# Patient Record
Sex: Male | Born: 1970 | Race: White | Hispanic: No | Marital: Married | Smoking: Never smoker
Health system: Southern US, Community
[De-identification: ages and names within clinical notes are randomized; demographics above are authoritative.]

## PROBLEM LIST (undated history)

## (undated) DIAGNOSIS — F4310 Post-traumatic stress disorder, unspecified: Secondary | ICD-10-CM

## (undated) DIAGNOSIS — N200 Calculus of kidney: Secondary | ICD-10-CM

## (undated) DIAGNOSIS — I10 Essential (primary) hypertension: Secondary | ICD-10-CM

## (undated) HISTORY — PX: LITHOTRIPSY: SUR834

---

## 2004-10-19 ENCOUNTER — Emergency Department: Payer: Self-pay | Admitting: Emergency Medicine

## 2012-05-18 ENCOUNTER — Ambulatory Visit: Payer: Self-pay | Admitting: Family Medicine

## 2012-05-26 ENCOUNTER — Ambulatory Visit: Payer: Self-pay | Admitting: Urology

## 2015-08-27 ENCOUNTER — Ambulatory Visit
Admission: EM | Admit: 2015-08-27 | Discharge: 2015-08-27 | Disposition: A | Discharge status: Home / Self Care | Payer: Self-pay | Attending: Family Medicine | Admitting: Family Medicine

## 2015-08-27 ENCOUNTER — Encounter: Payer: Self-pay | Admitting: Emergency Medicine

## 2015-08-27 DIAGNOSIS — Z024 Encounter for examination for driving license: Secondary | ICD-10-CM

## 2015-08-27 DIAGNOSIS — Z021 Encounter for pre-employment examination: Secondary | ICD-10-CM

## 2015-08-27 LAB — DEPT OF TRANSP DIPSTICK, URINE (ARMC ONLY)
GLUCOSE, UA: NEGATIVE mg/dL
HGB URINE DIPSTICK: NEGATIVE
Protein, ur: 30 mg/dL — AB
Specific Gravity, Urine: 1.025 (ref 1.005–1.030)

## 2015-08-27 NOTE — ED Notes (Signed)
Patient here for a DOT physical.

## 2015-08-27 NOTE — ED Provider Notes (Signed)
CSN: [id number]     Arrival date & time 08/27/15  0800 History   First MD Initiated Contact with Patient 08/27/15 [phone]     Chief Complaint  Patient presents with  . 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 No pertinent past medical history. Past Surgical History  Procedure Laterality Date  . Lithotripsy Left    Family History  Problem Relation Age of Onset  . Cancer Other   . Diabetes Other    Social History  Substance Use Topics  . Smoking status: Never Smoker   . Smokeless tobacco: Never Used  . Alcohol Use: No    Review of Systems   see scanned DOT  Constitutional: No fever. No headache. Eyes: No visual changes. ENT:No sore throat. Cardiovascular:Negative for chest pain/palpitations Respiratory: Negative for shortness of breath Gastrointestinal: No abdominal pain. No nausea,vomiting, diarrhea Genitourinary: Negative for dysuria. Normal urination. Musculoskeletal: Negative for back pain. FROM extremities without pain Skin: Negative for rash Neurological: Negative for headache, focal weakness or numbness  Allergies  Review of patient's allergies indicates no known allergies.  Home Medications   Prior to Admission medications   Not on File   Meds Ordered and Administered this Visit  Medications - No data to display  Ht 6\' 1"  (1.854 m)  Wt 309 lb (140.161 kg)  BMI 40.78 kg/m2 No data found.   Physical Exam.  BP  146/75  Constitutional -alert and oriented,well appearing and in no acute distress General: No  acute distress, obese Head-atraumatic, normocephalic Eyes- conjunctiva normal, EOMI ,conjugate gaze, peripheral OK Ears: grossly normal hearing, canals clear, TM neg Nose- no congestion or rhinorrhea Mouth/throat- mucous membranes moist ,oropharynx non-erythematous Neck- supple without glandular enlargement CV- regular rate and rhythmn Resp-no distress, normal respiratory effort,clear to auscultation bilaterally GI- soft,non-tender,no distention Back- no spinal or para-spinal tenderness, no CVAT, good flexion and extension GU- unremarkable, circumcised, no hernia identified,  MSK- non tender, normal ROM, ambulatory, no gait instability,on /off table solo; toe walk, heel walk. Squat return  Neuro- normal speech and language, no gross focal neurological deficit appreciated, II-XII wnl Skin-warm,dry ,intact;  Psych-mood and affect grossly normal; speech and behavior grossly normal  ED Course  Procedures (including critical care time)  Labs Review Labs Reviewed  DEPT OF TRANSP DIPSTICK, URINE([facility] ONLY) - Abnormal; Notable for the following:    Protein, ur 30 (*)    All other components within normal limits    Imaging Review No results found.   Visual Acuity Review  Right Eye Distance: 20/25 Left Eye Distance: 20/25 Bilateral Distance: 20/20   MDM   1. Encounter for commercial driver medical examination (CDME)     Weight loss encouraged- continue to advance duration and modify activities in gym. Plantar exercises and stretches reviewed Needs to contact PCP to see new assignment-  discussed protein on U/a , needs f/u  With Urology - call office to determine if direct  apptmt or via PCP  2 year certificate given  [id number]  Jan Fireman, PA-C 08/29/15 2256

## 2015-08-28 ENCOUNTER — Encounter: Payer: Self-pay | Admitting: Emergency Medicine

## 2015-08-29 ENCOUNTER — Encounter: Payer: Self-pay | Admitting: Physician Assistant

## 2016-08-21 DIAGNOSIS — F4310 Post-traumatic stress disorder, unspecified: Secondary | ICD-10-CM | POA: Insufficient documentation

## 2017-01-02 ENCOUNTER — Ambulatory Visit (INDEPENDENT_AMBULATORY_CARE_PROVIDER_SITE_OTHER): Payer: 59

## 2017-01-02 ENCOUNTER — Ambulatory Visit
Admission: EM | Admit: 2017-01-02 | Discharge: 2017-01-02 | Disposition: A | Discharge status: Home / Self Care | Payer: 59 | Attending: Emergency Medicine | Admitting: Emergency Medicine

## 2017-01-02 DIAGNOSIS — S62101A Fracture of unspecified carpal bone, right wrist, initial encounter for closed fracture: Secondary | ICD-10-CM

## 2017-01-02 HISTORY — DX: Calculus of kidney: N20.0

## 2017-01-02 HISTORY — DX: Post-traumatic stress disorder, unspecified: F43.10

## 2017-01-02 MED ORDER — IBUPROFEN 600 MG PO TABS: 600.0000 mg | ORAL_TABLET | Freq: Four times a day (QID) | ORAL | 0 refills | Status: DC | PRN

## 2017-01-02 NOTE — ED Notes (Signed)
Volar splint placed and PMS intact post-application
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
## 2017-01-02 NOTE — Discharge Instructions (Signed)
Take the medication as written. Take 1 gram of tylenol with the motrin up to 4 times a day as needed for pain and fever. This with is an effective combination for pain.   Follow-up with Dr. Caralyn Guile, hand surgeon in [place] in 2 weeks. Go to the ER for the signs and symptoms we discussed  Go to www.goodrx.com to look up your medications. This will give you a list of where you can find your prescriptions at the most affordable prices. Or ask the pharmacist what the cash price is. This can be less expensive than what you would pay with insurance.

## 2017-01-15 DIAGNOSIS — S63056A Dislocation of other carpometacarpal joint of unspecified hand, initial encounter: Secondary | ICD-10-CM | POA: Insufficient documentation

## 2017-02-17 HISTORY — PX: FRACTURE SURGERY: SHX138

## 2017-05-03 DIAGNOSIS — F419 Anxiety disorder, unspecified: Secondary | ICD-10-CM | POA: Insufficient documentation

## 2017-05-03 DIAGNOSIS — R454 Irritability and anger: Secondary | ICD-10-CM | POA: Insufficient documentation

## 2017-06-23 DIAGNOSIS — G4733 Obstructive sleep apnea (adult) (pediatric): Secondary | ICD-10-CM | POA: Insufficient documentation

## 2018-03-14 ENCOUNTER — Encounter: Payer: Self-pay | Admitting: Emergency Medicine

## 2018-03-14 ENCOUNTER — Other Ambulatory Visit: Payer: Self-pay

## 2018-03-14 ENCOUNTER — Ambulatory Visit
Admission: EM | Admit: 2018-03-14 | Discharge: 2018-03-14 | Disposition: A | Discharge status: Home / Self Care | Payer: BLUE CROSS/BLUE SHIELD | Attending: Family Medicine | Admitting: Family Medicine

## 2018-03-14 DIAGNOSIS — S39012A Strain of muscle, fascia and tendon of lower back, initial encounter: Secondary | ICD-10-CM | POA: Diagnosis not present

## 2018-03-14 DIAGNOSIS — R809 Proteinuria, unspecified: Secondary | ICD-10-CM | POA: Diagnosis not present

## 2018-03-14 LAB — URINALYSIS, COMPLETE (UACMP) WITH MICROSCOPIC
Bacteria, UA: NONE SEEN
GLUCOSE, UA: NEGATIVE mg/dL
Leukocytes, UA: NEGATIVE
Nitrite: NEGATIVE
PROTEIN: 30 mg/dL — AB
SPECIFIC GRAVITY, URINE: 1.025 (ref 1.005–1.030)
Squamous Epithelial / LPF: NONE SEEN (ref 0–5)
pH: 6 (ref 5.0–8.0)

## 2018-03-14 MED ORDER — HYDROCODONE-ACETAMINOPHEN 5-325 MG PO TABS: ORAL_TABLET | ORAL | 0 refills | Status: DC

## 2018-03-14 MED ORDER — CYCLOBENZAPRINE HCL 10 MG PO TABS: 10.0000 mg | ORAL_TABLET | Freq: Every day | ORAL | 0 refills | Status: DC
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
## 2020-01-12 DIAGNOSIS — Z1211 Encounter for screening for malignant neoplasm of colon: Secondary | ICD-10-CM | POA: Insufficient documentation

## 2020-05-22 DIAGNOSIS — H3522 Other non-diabetic proliferative retinopathy, left eye: Secondary | ICD-10-CM | POA: Insufficient documentation

## 2020-05-22 DIAGNOSIS — H3322 Serous retinal detachment, left eye: Secondary | ICD-10-CM | POA: Insufficient documentation

## 2020-12-16 ENCOUNTER — Encounter: Payer: Self-pay | Admitting: Emergency Medicine

## 2020-12-16 DIAGNOSIS — N200 Calculus of kidney: Secondary | ICD-10-CM | POA: Insufficient documentation

## 2020-12-16 DIAGNOSIS — N201 Calculus of ureter: Secondary | ICD-10-CM | POA: Insufficient documentation

## 2020-12-16 LAB — COMPREHENSIVE METABOLIC PANEL WITH GFR
ALT: 33 U/L (ref 0–44)
AST: 43 U/L — ABNORMAL HIGH (ref 15–41)
Albumin: 4.9 g/dL (ref 3.5–5.0)
Alkaline Phosphatase: 69 U/L (ref 38–126)
Anion gap: 10 (ref 5–15)
BUN: 20 mg/dL (ref 6–20)
CO2: 27 mmol/L (ref 22–32)
Calcium: 9.7 mg/dL (ref 8.9–10.3)
Chloride: 101 mmol/L (ref 98–111)
Creatinine, Ser: 1.37 mg/dL — ABNORMAL HIGH (ref 0.61–1.24)
GFR, Estimated: >60 mL/min
Glucose, Bld: 179 mg/dL — ABNORMAL HIGH (ref 70–99)
Potassium: 4.2 mmol/L (ref 3.5–5.1)
Sodium: 138 mmol/L (ref 135–145)
Total Bilirubin: 1.1 mg/dL (ref 0.3–1.2)
Total Protein: 7.8 g/dL (ref 6.5–8.1)

## 2020-12-16 LAB — CBC
HCT: 43.8 % (ref 39.0–52.0)
Hemoglobin: 15.5 g/dL (ref 13.0–17.0)
MCH: 29.8 pg (ref 26.0–34.0)
MCHC: 35.4 g/dL (ref 30.0–36.0)
MCV: 84.1 fL (ref 80.0–100.0)
Platelets: 231 K/uL (ref 150–400)
RBC: 5.21 MIL/uL (ref 4.22–5.81)
RDW: 11.9 % (ref 11.5–15.5)
WBC: 9.9 K/uL (ref 4.0–10.5)
nRBC: 0 % (ref 0.0–0.2)

## 2020-12-16 LAB — URINALYSIS, COMPLETE (UACMP) WITH MICROSCOPIC
Bacteria, UA: NONE SEEN
Bilirubin Urine: NEGATIVE
Glucose, UA: NEGATIVE mg/dL
Ketones, ur: NEGATIVE mg/dL
Leukocytes,Ua: NEGATIVE
Nitrite: NEGATIVE
Protein, ur: 30 mg/dL — AB
RBC / HPF: >50 RBC/hpf — ABNORMAL HIGH (ref 0–5)
Specific Gravity, Urine: 1.017 (ref 1.005–1.030)
pH: 6 (ref 5.0–8.0)

## 2020-12-16 LAB — LIPASE, BLOOD: Lipase: 87 U/L — ABNORMAL HIGH (ref 11–51)

## 2020-12-16 MED ORDER — MORPHINE SULFATE (PF) 4 MG/ML IV SOLN
4.0000 mg | Freq: Once | INTRAVENOUS | Status: AC
  Administered 2020-12-16: 4 mg via INTRAVENOUS
  Filled 2020-12-16: qty 1

## 2020-12-16 MED ORDER — ONDANSETRON HCL 4 MG/2ML IJ SOLN
4.0000 mg | Freq: Once | INTRAMUSCULAR | Status: AC
  Administered 2020-12-16: 4 mg via INTRAVENOUS
  Filled 2020-12-16: qty 2

## 2020-12-16 NOTE — ED Triage Notes (Signed)
Pt with hx/o kidney stones and reports right sided lower back pain that radiated into the right flank since Thursday. Pt also reports hematuria and emesis.

## 2020-12-17 ENCOUNTER — Emergency Department: Payer: BLUE CROSS/BLUE SHIELD

## 2020-12-17 ENCOUNTER — Emergency Department
Admission: EM | Admit: 2020-12-17 | Discharge: 2020-12-17 | Disposition: A | Discharge status: Home / Self Care | Payer: BLUE CROSS/BLUE SHIELD | Attending: Emergency Medicine | Admitting: Emergency Medicine

## 2020-12-17 DIAGNOSIS — N132 Hydronephrosis with renal and ureteral calculous obstruction: Secondary | ICD-10-CM

## 2020-12-17 DIAGNOSIS — N200 Calculus of kidney: Secondary | ICD-10-CM

## 2020-12-17 MED ORDER — DOCUSATE SODIUM 100 MG PO CAPS: ORAL_CAPSULE | ORAL | 0 refills | Status: DC

## 2020-12-17 MED ORDER — OXYCODONE-ACETAMINOPHEN 5-325 MG PO TABS: 2.0000 | ORAL_TABLET | Freq: Four times a day (QID) | ORAL | 0 refills | Status: DC | PRN

## 2020-12-17 MED ORDER — SODIUM CHLORIDE 0.9 % IV BOLUS
500.0000 mL | Freq: Once | INTRAVENOUS | Status: AC
  Administered 2020-12-17: 500 mL via INTRAVENOUS

## 2020-12-17 MED ORDER — KETOROLAC TROMETHAMINE 30 MG/ML IJ SOLN
15.0000 mg | Freq: Once | INTRAMUSCULAR | Status: AC
  Administered 2020-12-17: 15 mg via INTRAVENOUS
  Filled 2020-12-17: qty 1

## 2020-12-17 MED ORDER — TAMSULOSIN HCL 0.4 MG PO CAPS: ORAL_CAPSULE | ORAL | 0 refills | Status: DC

## 2020-12-17 MED ORDER — ONDANSETRON 4 MG PO TBDP: ORAL_TABLET | ORAL | 0 refills | Status: DC

## 2020-12-17 MED ORDER — OXYCODONE-ACETAMINOPHEN 5-325 MG PO TABS
2.0000 | ORAL_TABLET | Freq: Once | ORAL | Status: AC
Start: 2020-12-17 — End: 2020-12-17
  Administered 2020-12-17: 2 via ORAL
  Filled 2020-12-17: qty 2

## 2020-12-17 NOTE — ED Provider Notes (Signed)
[facility] Emergency Department Provider Note  ____________________________________________   Event Date/Time   First MD Initiated Contact with Patient 12/17/20 0013     (approximate)  I have reviewed the triage vital signs and the nursing notes.   HISTORY  Chief Complaint Flank Pain    HPI Craig Tangen. is a 50 y.o. male with extensive history of kidney stones, multiple episodes and 1 of which required lithotripsy years ago.  He presents tonight for acute onset severe right-sided flank pain that radiates into his right lower abdomen.  The symptoms started 5 days ago when he was sure it was additional kidney stones and he thought he can handle it but the pain became much more severe tonight.  He has had multiple episodes of vomiting as well.  He denies fever, sore throat, chest pain, shortness of breath.  He has noted some hematuria.  He received  morphine 4 mg IV upon arrival to the ED and is currently feeling better and was able to get some sleep.          Past Medical History:  Diagnosis Date  . Kidney stones   . PTSD (post-traumatic stress disorder)     There are no problems to display for this patient.   Past Surgical History:  Procedure Laterality Date  . FRACTURE SURGERY Right 02/2017  . LITHOTRIPSY Left     Prior to Admission medications   Medication Sig Start Date End Date Taking? Authorizing Provider  docusate sodium (COLACE) 100 MG capsule Take 1 tablet once or twice daily as needed for constipation while taking narcotic pain medicine 12/17/20  Yes Hinda Kehr, MD  ondansetron (ZOFRAN ODT) 4 MG disintegrating tablet Allow 1-2 tablets to dissolve in your mouth every 8 hours as needed for nausea/vomiting 12/17/20  Yes Hinda Kehr, MD  oxyCODONE-acetaminophen (PERCOCET) 5-325 MG tablet Take 2 tablets by mouth every 6 (six) hours as needed for severe pain. 12/17/20  Yes Hinda Kehr, MD  tamsulosin (FLOMAX) 0.4 MG CAPS  capsule Take 1 tablet by mouth daily until you pass the kidney stone or no longer have symptoms 12/17/20  Yes Hinda Kehr, MD  busPIRone (BUSPAR) 5 MG tablet Take 5 mg by mouth 2 (two) times daily.    [provider]  cyclobenzaprine (FLEXERIL) 10 MG tablet Take 1 tablet (10 mg total) by mouth at bedtime. 03/14/18   Norval Gable, MD  DULoxetine (CYMBALTA) 30 MG capsule  02/20/18   [provider]  HYDROcodone-acetaminophen (NORCO/VICODIN) 5-325 MG tablet 1-2 tabs po bid prn 03/14/18   Norval Gable, MD  ibuprofen (ADVIL,MOTRIN) 600 MG tablet Take 1 tablet (600 mg total) by mouth every 6 (six) hours as needed. 01/02/17   Melynda Ripple, MD    Allergies Patient has no known allergies.  Family History  Problem Relation Age of Onset  . Cancer Other   . Diabetes Other     Social History Social History   Tobacco Use  . Smoking status: Never Smoker  . Smokeless tobacco: Never Used  Vaping Use  . Vaping Use: Never used  Substance Use Topics  . Alcohol use: Yes    Comment: social  . Drug use: No    Review of Systems Constitutional: No fever/chills Eyes: No visual changes. ENT: No sore throat. Cardiovascular: Denies chest pain. Respiratory: Denies shortness of breath. Gastrointestinal: Right-sided flank and abdominal pain with nausea and vomiting for about 5 days but worsened tonight. Genitourinary: Negative for dysuria. Musculoskeletal: Right-sided flank  pain. Integumentary: Negative for rash. Neurological: Negative for headaches, focal weakness or numbness.   ____________________________________________   PHYSICAL EXAM:  VITAL SIGNS: ED Triage Vitals  Enc Vitals Group     BP 12/16/20 2234 (!) 181/103     Pulse Rate 12/16/20 2234 82     Resp 12/16/20 2234 (!) 21     Temp 12/16/20 2234 97.7 F (36.5 C)     Temp Source 12/16/20 2234 Oral     SpO2 12/16/20 2234 98 %     Weight --      Height --      Head Circumference --      Peak Flow --       Pain Score 12/17/20 0014 5     Pain Loc --      Pain Edu? --      Excl. in [place]? --     Constitutional: Alert and oriented.  Eyes: Conjunctivae are normal.  Head: Atraumatic. Nose: No congestion/rhinnorhea. Mouth/Throat: Patient is wearing a mask. Neck: No stridor.  No meningeal signs.   Cardiovascular: Normal rate, regular rhythm. Good peripheral circulation. Respiratory: Normal respiratory effort.  No retractions. Gastrointestinal: Soft and distended.  Mild tenderness to palpation of the right side of his abdomen. Musculoskeletal: Mild right flank tenderness to percussion. Neurologic:  Normal speech and language. No gross focal neurologic deficits are appreciated.  Skin:  Skin is warm, dry and intact. Psychiatric: Mood and affect are normal. Speech and behavior are normal.  ____________________________________________   LABS (all labs ordered are listed, but only abnormal results are displayed)  Labs Reviewed  LIPASE, BLOOD - Abnormal; Notable for the following components:      Result Value   Lipase 87 (*)    All other components within normal limits  COMPREHENSIVE METABOLIC PANEL - Abnormal; Notable for the following components:   Glucose, Bld 179 (*)    Creatinine, Ser 1.37 (*)    AST 43 (*)    All other components within normal limits  URINALYSIS, COMPLETE (UACMP) WITH MICROSCOPIC - Abnormal; Notable for the following components:   Color, Urine YELLOW (*)    APPearance HAZY (*)    Hgb urine dipstick LARGE (*)    Protein, ur 30 (*)    RBC / HPF >50 (*)    All other components within normal limits  CBC   ____________________________________________   RADIOLOGY I, Hinda Kehr, personally viewed and evaluated these images (plain radiographs) as part of my medical decision making, as well as reviewing the written report by the radiologist.  ED MD interpretation: Multiple right ureteral stones with obstruction and hydronephrosis.  Official radiology report(s): CT  Renal Stone Study  Result Date: 12/17/2020 CLINICAL DATA:  Right-sided low back pain, right flank pain, hematuria, emesis EXAM: CT ABDOMEN AND PELVIS WITHOUT CONTRAST TECHNIQUE: Multidetector CT imaging of the abdomen and pelvis was performed following the standard protocol without IV contrast. COMPARISON:  05/18/2012 FINDINGS: Lower chest: No acute pleural or parenchymal lung disease. Hepatobiliary: No focal liver abnormality is seen. No gallstones, gallbladder wall thickening, or biliary dilatation. Pancreas: Unremarkable. No pancreatic ductal dilatation or surrounding inflammatory changes. Spleen: Normal in size without focal abnormality. Adrenals/Urinary Tract: There is high-grade right-sided obstructive uropathy, with right renal edema and mild perinephric fat stranding. There are 2 obstructing right ureteral calculi, measuring 9 mm at the right UPJ image 48/2 and 7 mm in the mid right ureter image 78/2. A nonobstructing 4 mm distal right ureteral calculus is identified reference image 85/2.  There is a nonobstructing 8 mm calculus in a lower pole calyx of the right kidney. Multiple punctate nonobstructing left renal calculi are seen largest measuring 4 mm in the lower pole. No left-sided obstruction. The bladder is decompressed, limiting evaluation. The adrenals are unremarkable. Stomach/Bowel: No bowel obstruction or ileus. Normal appendix right lower quadrant. No bowel wall thickening or inflammatory change. Vascular/Lymphatic: No significant vascular findings are present. No enlarged abdominal or pelvic lymph nodes. Reproductive: Prostate is unremarkable. Other: No free fluid or free gas. Fat containing left inguinal hernia again noted. No bowel herniation. Musculoskeletal: No acute or destructive bony lesions. Reconstructed images demonstrate no additional findings. IMPRESSION: 1. High-grade right-sided obstructive uropathy, with obstructing right calculi at the UPJ and mid right ureter as described  above. 2. Bilateral nonobstructing renal calculi. 3. Fat containing left inguinal hernia. Electronically Signed   By: Randa Ngo M.D.   On: 12/17/2020 00:22    ____________________________________________   PROCEDURES   Procedure(s) performed (including Critical Care):  Procedures   ____________________________________________   INITIAL IMPRESSION / MDM / [place] / ED COURSE  As part of my medical decision making, I reviewed the following data within the [facility] notes reviewed and incorporated, Labs reviewed , Old chart reviewed and Notes from prior ED visits   Differential diagnosis includes, but is not limited to, kidney stone, appendicitis, UTI/pyelonephritis, infected stone.  Vitals are stable.  Patient feels much better after morphine 4 mg and was able to sleep.  Hematuria but no evidence of infection.  Comprehensive metabolic panel is generally reassuring; I do not know if his creatinine is usually 1.37 I ordered 500 mL normal saline, but he does not appear dehydrated.  Lipase is slightly elevated at 87 which I suspect is due to his recent vomiting but he has no epigastric tenderness to palpation and no persistent vomiting.  No leukocytosis.  CT scan indicates multiple right-sided ureteral stones with obstruction and hydronephrosis.  Since the patient's pain is well controlled he is appropriate for discharge and close outpatient follow-up for probable lithotripsy given the size of the stones which include a 9 mm, 7 mm, and 4 mm stone.  Patient and his wife understand and agree and they know to come back to the emergency department if his pain is uncontrolled or if he develops new or worsening symptoms.  Prior to discharge I ordered Toradol 15 mg IV and 2 Percocet, in addition to the morphine 4 mg IV and the Zofran 4 mg IV he received in triage.  Prescriptions as listed below.            ____________________________________________  FINAL CLINICAL IMPRESSION(S) / ED DIAGNOSES  Final diagnoses:  Kidney stone  Ureteral stone with hydronephrosis     MEDICATIONS GIVEN DURING THIS VISIT:  Medications  morphine 4 MG/ML injection 4 mg (4 mg Intravenous Given 12/16/20 2247)  ondansetron (ZOFRAN) injection 4 mg (4 mg Intravenous Given 12/16/20 2243)  sodium chloride 0.9 % bolus 500 mL (500 mLs Intravenous New Bag/Given 12/17/20 0115)  ketorolac (TORADOL) 30 MG/ML injection 15 mg (15 mg Intravenous Given 12/17/20 0114)  oxyCODONE-acetaminophen (PERCOCET/ROXICET) 5-325 MG per tablet 2 tablet (2 tablets Oral Given 12/17/20 0113)     ED Discharge Orders         Ordered    oxyCODONE-acetaminophen (PERCOCET) 5-325 MG tablet  Every 6 hours PRN        12/17/20 0154    ondansetron (ZOFRAN ODT) 4 MG disintegrating tablet  12/17/20 0154    tamsulosin (FLOMAX) 0.4 MG CAPS capsule        12/17/20 0154    docusate sodium (COLACE) 100 MG capsule        12/17/20 0154           Note:  This document was prepared using Dragon voice recognition software and may include unintentional dictation errors.   Hinda Kehr, MD 12/17/20 [phone]

## 2020-12-17 NOTE — Discharge Instructions (Addendum)
You have been seen in the Emergency Department (ED) today for pain caused by kidney stones.  As we have discussed, please drink plenty of fluids.  Please make a follow up appointment with the physician(s) listed elsewhere in this documentation.  You may take pain medication as needed but ONLY as prescribed.  Please also take your prescribed Flomax daily.  If you are going to follow up with Urology for possible lithotripsy, please do not take any ibuprofen, naproxen, aspirin, Toradol, or other NSAID after Tuesday morning, as this may exclude you from lithotripsy.  Please see your doctor as soon as possible as stones may take 1-3 weeks to pass and you may require additional care or medications.  Do not drink alcohol, drive or participate in any other potentially dangerous activities while taking opiate pain medication as it may make you sleepy. Do not take this medication with any other sedating medications, either prescription or over-the-counter. If you were prescribed Percocet or Vicodin, do not take these with acetaminophen (Tylenol) as it is already contained within these medications.   Take Percocet as needed for severe pain.  This medication is an opiate (or narcotic) pain medication and can be habit forming.  Use it as little as possible to achieve adequate pain control.  Do not use or use it with extreme caution if you have a history of opiate abuse or dependence.  If you are on a pain contract with your primary care doctor or a pain specialist, be sure to let them know you were prescribed this medication today from the [facility].  This medication is intended for your use only - do not give any to anyone else and keep it in a secure place where nobody else, especially children, have access to it.  It will also cause or worsen constipation, so you may want to consider taking an over-the-counter stool softener while you are taking this medication.  Return to the Emergency  Department (ED) or call your doctor if you have any worsening pain, fever, painful urination, are unable to urinate, or develop other symptoms that concern you.  

## 2020-12-18 ENCOUNTER — Encounter: Payer: Self-pay | Admitting: Urology

## 2020-12-18 ENCOUNTER — Ambulatory Visit (INDEPENDENT_AMBULATORY_CARE_PROVIDER_SITE_OTHER): Payer: Self-pay | Admitting: Urology

## 2020-12-18 ENCOUNTER — Other Ambulatory Visit: Payer: Self-pay

## 2020-12-18 VITALS — BP 137/88 | HR 76 | Temp 98.2°F | Ht 73.0 in | Wt 290.0 lb

## 2020-12-18 DIAGNOSIS — N179 Acute kidney failure, unspecified: Secondary | ICD-10-CM

## 2020-12-18 DIAGNOSIS — N200 Calculus of kidney: Secondary | ICD-10-CM

## 2020-12-18 DIAGNOSIS — N201 Calculus of ureter: Secondary | ICD-10-CM

## 2020-12-18 DIAGNOSIS — N1330 Unspecified hydronephrosis: Secondary | ICD-10-CM

## 2020-12-18 NOTE — H&P (View-Only) (Signed)
12/18/2020 4:33 PM   Craig Vargas. [birth year] [id number]  Referring provider: No referring provider defined for this encounter.  Chief Complaint  Patient presents with  . Nephrolithiasis    HPI: 50 year old male who presents today for follow-up of kidney stones.  He was seen yesterday with 5 days of severe right-sided flank pain.  Labs in the ER were unremarkable other than for mildly elevated creatinine to 1.37.  Urinalysis was consistent with acute stone event, greater than 50 red blood cells, 6-10 white blood cells, no bacteria nitrate negative.  He underwent a noncontrast CT stone protocol which showed high-grade obstruction from right UPJ mid ureteral calculus measuring 9 mm and 7 mm respectively.  There is also a 4 mm right distal ureteral stone.  In addition to this, he is a 8 mm nonobstructing right lower pole stone.  He does have a personal history of kidney stones and underwent lithotripsy in the remote past.   This was greater than 10 years ago.  It was effective.  Today, he continues to have severe right flank pain.  He just took a pain pill prior to this visit.  He has had a poor appetite as well as some nausea.  No urinary symptoms including no dysuria or gross hematuria.  No fevers or chills.   PMH: Past Medical History:  Diagnosis Date  . Kidney stones   . PTSD (post-traumatic stress disorder)     Surgical History: Past Surgical History:  Procedure Laterality Date  . FRACTURE SURGERY Right 02/2017  . LITHOTRIPSY Left     Home Medications:  Allergies as of 12/18/2020      Reactions   Prednisolone Anxiety   Irritable      Medication List       Accurate as of December 18, 2020  4:33 PM. If you have any questions, ask your nurse or doctor.        STOP taking these medications   cyclobenzaprine 10 MG tablet Commonly known as: FLEXERIL Stopped by: Hollice Espy, MD   HYDROcodone-acetaminophen 5-325 MG tablet Commonly known as:  NORCO/VICODIN Stopped by: Hollice Espy, MD   ibuprofen 600 MG tablet Commonly known as: ADVIL Stopped by: Hollice Espy, MD     TAKE these medications   busPIRone 5 MG tablet Commonly known as: BUSPAR Take 5 mg by mouth 2 (two) times daily.   docusate sodium 100 MG capsule Commonly known as: Colace Take 1 tablet once or twice daily as needed for constipation while taking narcotic pain medicine   DULoxetine 30 MG capsule Commonly known as: CYMBALTA   losartan 25 MG tablet Commonly known as: COZAAR losartan 25 mg tablet   ondansetron 4 MG disintegrating tablet Commonly known as: Zofran ODT Allow 1-2 tablets to dissolve in your mouth every 8 hours as needed for nausea/vomiting   oxyCODONE-acetaminophen 5-325 MG tablet Commonly known as: Percocet Take 2 tablets by mouth every 6 (six) hours as needed for severe pain.   rosuvastatin 10 MG tablet Commonly known as: CRESTOR Take 10 mg by mouth at bedtime.   tamsulosin 0.4 MG Caps capsule Commonly known as: FLOMAX Take 1 tablet by mouth daily until you pass the kidney stone or no longer have symptoms       Allergies:  Allergies  Allergen Reactions  . Prednisolone Anxiety    Irritable    Family History: Family History  Problem Relation Age of Onset  . Cancer Other   . Diabetes Other  Social History:  reports that he has never smoked. He has never used smokeless tobacco. He reports current alcohol use. He reports that he does not use drugs.   Physical Exam: BP 137/88   Pulse 76   Temp 98.2 F (36.8 C)   Ht 6\' 1"  (1.854 m)   Wt 290 lb (131.5 kg)   BMI 38.26 kg/m   Constitutional:  Alert and oriented, No acute distress.  Accompanied by his wife today. HEENT: [place] AT, moist mucus membranes.  Trachea midline, no masses. Cardiovascular: No clubbing, cyanosis, or edema. Respiratory: Normal respiratory effort, no increased work of breathing. Skin: No rashes, bruises or suspicious lesions. Neurologic:  Grossly intact, no focal deficits, moving all 4 extremities. Psychiatric: Normal mood and affect.  Laboratory Data: Lab Results  Component Value Date   WBC 9.9 12/16/2020   HGB 15.5 12/16/2020   HCT 43.8 12/16/2020   MCV 84.1 12/16/2020   PLT 231 12/16/2020    Lab Results  Component Value Date   CREATININE 1.37 (H) 12/16/2020    Urinalysis Urinalysis reviewed today, similar to emergency room, no concern for UTI  Pertinent Imaging: CT Renal Stone Study  Narrative CLINICAL DATA:  Right-sided low back pain, right flank pain, hematuria, emesis  EXAM: CT ABDOMEN AND PELVIS WITHOUT CONTRAST  TECHNIQUE: Multidetector CT imaging of the abdomen and pelvis was performed following the standard protocol without IV contrast.  COMPARISON:  05/18/2012  FINDINGS: Lower chest: No acute pleural or parenchymal lung disease.  Hepatobiliary: No focal liver abnormality is seen. No gallstones, gallbladder wall thickening, or biliary dilatation.  Pancreas: Unremarkable. No pancreatic ductal dilatation or surrounding inflammatory changes.  Spleen: Normal in size without focal abnormality.  Adrenals/Urinary Tract: There is high-grade right-sided obstructive uropathy, with right renal edema and mild perinephric fat stranding. There are 2 obstructing right ureteral calculi, measuring 9 mm at the right UPJ image 48/2 and 7 mm in the mid right ureter image 78/2. A nonobstructing 4 mm distal right ureteral calculus is identified reference image 85/2. There is a nonobstructing 8 mm calculus in a lower pole calyx of the right kidney.  Multiple punctate nonobstructing left renal calculi are seen largest measuring 4 mm in the lower pole. No left-sided obstruction.  The bladder is decompressed, limiting evaluation. The adrenals are unremarkable.  Stomach/Bowel: No bowel obstruction or ileus. Normal appendix right lower quadrant. No bowel wall thickening or inflammatory  change.  Vascular/Lymphatic: No significant vascular findings are present. No enlarged abdominal or pelvic lymph nodes.  Reproductive: Prostate is unremarkable.  Other: No free fluid or free gas. Fat containing left inguinal hernia again noted. No bowel herniation.  Musculoskeletal: No acute or destructive bony lesions. Reconstructed images demonstrate no additional findings.  IMPRESSION: 1. High-grade right-sided obstructive uropathy, with obstructing right calculi at the UPJ and mid right ureter as described above. 2. Bilateral nonobstructing renal calculi. 3. Fat containing left inguinal hernia.   Electronically Signed By: Randa Ngo M.D. On: 12/17/2020 00:22  CT stone protocol images personally reviewed, agree with radiologic interpretation.  Multiple right ureteral calculi with high-grade obstruction.  Assessment & Plan:    1. Right ureteral calculus Multiple right ureteral calculi as outlined above  Given the high-grade obstruction, persistent pain, as well as number and location of the stones in size, is unlikely that he would be able to pass the spontaneously.  We discussed various treatment options for urolithiasis including observation with or without medical expulsive therapy, shockwave lithotripsy (SWL), ureteroscopy and laser lithotripsy  with stent placement, and percutaneous nephrolithotomy.   We discussed that management is based on stone size, location, density, patient co-morbidities, and patient preference.    Stones <19mm in size have a >80% spontaneous passage rate. Data surrounding the use of tamsulosin for medical expulsive therapy is controversial, but meta analyses suggests it is most efficacious for distal stones between 5-28mm in size. Possible side effects include dizziness/lightheadedness, and retrograde ejaculation.   Ureteroscopy with laser lithotripsy and stent placement has a higher stone free rate than SWL in a single procedure, however  increased complication rate including possible infection, ureteral injury, bleeding, and stent related morbidity. Common stent related symptoms include dysuria, urgency/frequency, and flank pain.  Given multifocal ureteral calculi, I do not think that he is a good candidate for ESWL and this was not offered today.   After an extensive discussion of the risks and benefits of the above treatment options, the patient would like to proceed with right ureteroscopy, laser lithotripsy and right ureteral stent placement  Warning symptoms reviewed, indications for more urgent/emergent evaluation were discussed.  - Urinalysis, Complete  2. Hydronephrosis of right kidney Secondary to #1  3. Right renal stone We will treat nonobstructing stone along with ureteral calculi  4. Acute kidney injury ([place]) Secondary to high-grade obstruction and dehydration   Hollice Espy, MD  [facility] [address], [place], [place] [address] [phone]

## 2020-12-18 NOTE — Patient Instructions (Signed)
Ureteroscopy Ureteroscopy is a procedure to check for and treat problems inside part of the urinary tract. In this procedure, a thin, flexible tube with a light at the end (ureteroscope) is used to look at the inside of the kidneys and the ureters. The ureters are the tubes that carry urine from the kidneys to the bladder. The ureteroscope is inserted into one or both of the ureters. You may need this procedure if you have frequent urinary tract infections (UTIs), blood in your urine, or a stone in one of your ureters. A ureteroscopy can be done:  To find the cause of urine blockage in a ureter and to evaluate other abnormalities inside the ureters or kidneys.  To remove stones.  To remove or treat growths of tissue (polyps), abnormal tissue, and some types of tumors.  To remove a tissue sample and check it for disease under a microscope (biopsy). Tell a health care provider about:  Any allergies you have.  All medicines you are taking, including vitamins, herbs, eye drops, creams, and over-the-counter medicines.  Any problems you or family members have had with anesthetic medicines.  Any blood disorders you have.  Any surgeries you have had.  Any medical conditions you have.  Whether you are pregnant or may be pregnant. What are the risks? Generally, this is a safe procedure. However, problems may occur, including:  Bleeding.  Infection.  Allergic reactions to medicines.  Scarring that narrows the ureter (stricture).  Creating a hole in the ureter (perforation). What happens before the procedure? Staying hydrated Follow instructions from your health care provider about hydration, which may include:  Up to 2 hours before the procedure - you may continue to drink clear liquids, such as water, clear fruit juice, black coffee, and plain tea.   Eating and drinking restrictions Follow instructions from your health care provider about eating and drinking, which may include:  8  hours before the procedure - stop eating heavy meals or foods, such as meat, fried foods, or fatty foods.  6 hours before the procedure - stop eating light meals or foods, such as toast or cereal.  6 hours before the procedure - stop drinking milk or drinks that contain milk.  2 hours before the procedure - stop drinking clear liquids. Medicines Ask your health care provider about:  Changing or stopping your regular medicines. This is especially important if you are taking diabetes medicines or blood thinners.  Taking medicines such as aspirin and ibuprofen. These medicines can thin your blood. Do not take these medicines unless your health care provider tells you to take them.  Taking over-the-counter medicines, vitamins, herbs, and supplements. General instructions  Do not use any products that contain nicotine or tobacco for at least 4 weeks before the procedure. These products include cigarettes, e-cigarettes, and chewing tobacco. If you need help quitting, ask your health care provider.  You may have a urine sample taken to check for infection.  Plan to have someone take you home from the hospital or clinic.  If you will be going home right after the procedure, plan to have someone with you for 24 hours.  Ask your health care provider what steps will be taken to help prevent infection. These may include: ? Washing skin with a germ-killing soap. ? Receiving antibiotic medicine. What happens during the procedure?  An IV will be inserted into one of your veins.  You will be given one or more of the following: ? A medicine to help  you relax (sedative). ? A medicine to make you fall asleep (general anesthetic). ? A medicine that is injected into your spine to numb the area below and slightly above the injection site (spinal anesthetic).  The part of your body that drains urine from your bladder (urethra) will be cleaned with a germ-killing solution.  The ureteroscope will be  passed through your urethra into your bladder.  A salt-water solution will be sent through the ureteroscope to fill your bladder. This will help the health care provider see the openings of your ureters more clearly.  The ureteroscope will be passed into your ureter. ? If a growth is found, a biopsy may be done. ? If a stone is found, it may be removed through the ureteroscope, or the stone may be broken up using a laser, shock waves, or electrical energy. ? In some cases, if the ureter is too small, a tube may be inserted that keeps the ureter open (ureteral stent). The stent may be left in place for 1 or 2 weeks to keep the ureter open, and then the ureteroscopy procedure will be done.  The scope will be removed, and your bladder will be emptied. The procedure may vary among health care providers and hospitals.   What can I expect after the procedure? After your procedure, it is common to have:  Your blood pressure, heart rate, breathing rate, and blood oxygen level monitored until you leave the hospital or clinic.  A burning sensation when you urinate. You may be asked to urinate.  Blood in your urine.  Mild discomfort in your bladder area or kidney area when urinating.  A need to urinate more often or urgently. Follow these instructions at home: Medicines  Take over-the-counter and prescription medicines only as told by your health care provider.  If you were prescribed an antibiotic medicine, take it as told by your health care provider. Do not stop taking the antibiotic even if you start to feel better. General instructions  If you were given a sedative during the procedure, it can affect you for several hours. Do not drive or operate machinery until your health care provider says that it is safe.  To relieve burning, take a warm bath or hold a warm washcloth over your groin.  Drink enough fluid to keep your urine pale yellow. ? Drink two 8-ounce (237 mL) glasses of water  every hour for the first 2 hours after you get home. ? Continue to drink water often at home.  You can eat what you normally do.  Keep all follow-up visits as told by your health care provider. This is important. ? If you had a ureteral stent placed, ask your health care provider when you need to return to have it removed.   Contact a health care provider if you have:  Chills or a fever.  Burning pain for longer than 24 hours after the procedure.  Blood in your urine for longer than 24 hours after the procedure. Get help right away if you have:  Large amounts of blood in your urine.  Blood clots in your urine.  Severe pain.  Chest pain or trouble breathing.  The feeling of a full bladder and you are unable to urinate. These symptoms may represent a serious problem that is an emergency. Do not wait to see if the symptoms will go away. Get medical help right away. Call your local emergency services (911 in the [place] Summary  Ureteroscopy is a procedure to  check for and treat problems inside part of the urinary tract.  In this procedure, a thin, flexible tube with a light at the end (ureteroscope) is used to look at the inside of the kidneys and the ureters.  You may need this procedure if you have frequent urinary tract infections (UTIs), blood in your urine, or a stone in a ureter. This information is not intended to replace advice given to you by your health care provider. Make sure you discuss any questions you have with your health care provider. Document Revised: 04/12/2019 Document Reviewed: 04/12/2019 Elsevier Patient Education  [place].

## 2020-12-18 NOTE — Progress Notes (Signed)
12/18/2020 4:33 PM   Craig Vargas. Jul 31, 1970 [id number]  Referring provider: No referring provider defined for this encounter.  Chief Complaint  Patient presents with  . Nephrolithiasis    HPI: 50 year old male who presents today for follow-up of kidney stones.  He was seen yesterday with 5 days of severe right-sided flank pain.  Labs in the ER were unremarkable other than for mildly elevated creatinine to 1.37.  Urinalysis was consistent with acute stone event, greater than 50 red blood cells, 6-10 white blood cells, no bacteria nitrate negative.  He underwent a noncontrast CT stone protocol which showed high-grade obstruction from right UPJ mid ureteral calculus measuring 9 mm and 7 mm respectively.  There is also a 4 mm right distal ureteral stone.  In addition to this, he is a 8 mm nonobstructing right lower pole stone.  He does have a personal history of kidney stones and underwent lithotripsy in the remote past.   This was greater than 10 years ago.  It was effective.  Today, he continues to have severe right flank pain.  He just took a pain pill prior to this visit.  He has had a poor appetite as well as some nausea.  No urinary symptoms including no dysuria or gross hematuria.  No fevers or chills.   PMH: Past Medical History:  Diagnosis Date  . Kidney stones   . PTSD (post-traumatic stress disorder)     Surgical History: Past Surgical History:  Procedure Laterality Date  . FRACTURE SURGERY Right 02/2017  . LITHOTRIPSY Left     Home Medications:  Allergies as of 12/18/2020      Reactions   Prednisolone Anxiety   Irritable      Medication List       Accurate as of December 18, 2020  4:33 PM. If you have any questions, ask your nurse or doctor.        STOP taking these medications   cyclobenzaprine 10 MG tablet Commonly known as: FLEXERIL Stopped by: Hollice Espy, MD   HYDROcodone-acetaminophen 5-325 MG tablet Commonly known as:  NORCO/VICODIN Stopped by: Hollice Espy, MD   ibuprofen 600 MG tablet Commonly known as: ADVIL Stopped by: Hollice Espy, MD     TAKE these medications   busPIRone 5 MG tablet Commonly known as: BUSPAR Take 5 mg by mouth 2 (two) times daily.   docusate sodium 100 MG capsule Commonly known as: Colace Take 1 tablet once or twice daily as needed for constipation while taking narcotic pain medicine   DULoxetine 30 MG capsule Commonly known as: CYMBALTA   losartan 25 MG tablet Commonly known as: COZAAR losartan 25 mg tablet   ondansetron 4 MG disintegrating tablet Commonly known as: Zofran ODT Allow 1-2 tablets to dissolve in your mouth every 8 hours as needed for nausea/vomiting   oxyCODONE-acetaminophen 5-325 MG tablet Commonly known as: Percocet Take 2 tablets by mouth every 6 (six) hours as needed for severe pain.   rosuvastatin 10 MG tablet Commonly known as: CRESTOR Take 10 mg by mouth at bedtime.   tamsulosin 0.4 MG Caps capsule Commonly known as: FLOMAX Take 1 tablet by mouth daily until you pass the kidney stone or no longer have symptoms       Allergies:  Allergies  Allergen Reactions  . Prednisolone Anxiety    Irritable    Family History: Family History  Problem Relation Age of Onset  . Cancer Other   . Diabetes Other  Social History:  reports that he has never smoked. He has never used smokeless tobacco. He reports current alcohol use. He reports that he does not use drugs.   Physical Exam: BP 137/88   Pulse 76   Temp 98.2 F (36.8 C)   Ht 6\' 1"  (1.854 m)   Wt 290 lb (131.5 kg)   BMI 38.26 kg/m   Constitutional:  Alert and oriented, No acute distress.  Accompanied by his wife today. HEENT: [place] AT, moist mucus membranes.  Trachea midline, no masses. Cardiovascular: No clubbing, cyanosis, or edema. Respiratory: Normal respiratory effort, no increased work of breathing. Skin: No rashes, bruises or suspicious lesions. Neurologic:  Grossly intact, no focal deficits, moving all 4 extremities. Psychiatric: Normal mood and affect.  Laboratory Data: Lab Results  Component Value Date   WBC 9.9 12/16/2020   HGB 15.5 12/16/2020   HCT 43.8 12/16/2020   MCV 84.1 12/16/2020   PLT 231 12/16/2020    Lab Results  Component Value Date   CREATININE 1.37 (H) 12/16/2020    Urinalysis Urinalysis reviewed today, similar to emergency room, no concern for UTI  Pertinent Imaging: CT Renal Stone Study  Narrative CLINICAL DATA:  Right-sided low back pain, right flank pain, hematuria, emesis  EXAM: CT ABDOMEN AND PELVIS WITHOUT CONTRAST  TECHNIQUE: Multidetector CT imaging of the abdomen and pelvis was performed following the standard protocol without IV contrast.  COMPARISON:  05/18/2012  FINDINGS: Lower chest: No acute pleural or parenchymal lung disease.  Hepatobiliary: No focal liver abnormality is seen. No gallstones, gallbladder wall thickening, or biliary dilatation.  Pancreas: Unremarkable. No pancreatic ductal dilatation or surrounding inflammatory changes.  Spleen: Normal in size without focal abnormality.  Adrenals/Urinary Tract: There is high-grade right-sided obstructive uropathy, with right renal edema and mild perinephric fat stranding. There are 2 obstructing right ureteral calculi, measuring 9 mm at the right UPJ image 48/2 and 7 mm in the mid right ureter image 78/2. A nonobstructing 4 mm distal right ureteral calculus is identified reference image 85/2. There is a nonobstructing 8 mm calculus in a lower pole calyx of the right kidney.  Multiple punctate nonobstructing left renal calculi are seen largest measuring 4 mm in the lower pole. No left-sided obstruction.  The bladder is decompressed, limiting evaluation. The adrenals are unremarkable.  Stomach/Bowel: No bowel obstruction or ileus. Normal appendix right lower quadrant. No bowel wall thickening or inflammatory  change.  Vascular/Lymphatic: No significant vascular findings are present. No enlarged abdominal or pelvic lymph nodes.  Reproductive: Prostate is unremarkable.  Other: No free fluid or free gas. Fat containing left inguinal hernia again noted. No bowel herniation.  Musculoskeletal: No acute or destructive bony lesions. Reconstructed images demonstrate no additional findings.  IMPRESSION: 1. High-grade right-sided obstructive uropathy, with obstructing right calculi at the UPJ and mid right ureter as described above. 2. Bilateral nonobstructing renal calculi. 3. Fat containing left inguinal hernia.   Electronically Signed By: Randa Ngo M.D. On: 12/17/2020 00:22  CT stone protocol images personally reviewed, agree with radiologic interpretation.  Multiple right ureteral calculi with high-grade obstruction.  Assessment & Plan:    1. Right ureteral calculus Multiple right ureteral calculi as outlined above  Given the high-grade obstruction, persistent pain, as well as number and location of the stones in size, is unlikely that he would be able to pass the spontaneously.  We discussed various treatment options for urolithiasis including observation with or without medical expulsive therapy, shockwave lithotripsy (SWL), ureteroscopy and laser lithotripsy  with stent placement, and percutaneous nephrolithotomy.   We discussed that management is based on stone size, location, density, patient co-morbidities, and patient preference.    Stones <17mm in size have a >80% spontaneous passage rate. Data surrounding the use of tamsulosin for medical expulsive therapy is controversial, but meta analyses suggests it is most efficacious for distal stones between 5-89mm in size. Possible side effects include dizziness/lightheadedness, and retrograde ejaculation.   Ureteroscopy with laser lithotripsy and stent placement has a higher stone free rate than SWL in a single procedure, however  increased complication rate including possible infection, ureteral injury, bleeding, and stent related morbidity. Common stent related symptoms include dysuria, urgency/frequency, and flank pain.  Given multifocal ureteral calculi, I do not think that he is a good candidate for ESWL and this was not offered today.   After an extensive discussion of the risks and benefits of the above treatment options, the patient would like to proceed with right ureteroscopy, laser lithotripsy and right ureteral stent placement  Warning symptoms reviewed, indications for more urgent/emergent evaluation were discussed.  - Urinalysis, Complete  2. Hydronephrosis of right kidney Secondary to #1  3. Right renal stone We will treat nonobstructing stone along with ureteral calculi  4. Acute kidney injury ([place]) Secondary to high-grade obstruction and dehydration   Hollice Espy, MD  [facility] [address], [place], [place] [address] [phone]

## 2020-12-19 ENCOUNTER — Encounter
Admission: RE | Disposition: A | Discharge status: Home / Self Care | Payer: Self-pay | Source: Home / Self Care | Attending: Urology

## 2020-12-19 ENCOUNTER — Ambulatory Visit: Payer: Self-pay | Admitting: Anesthesiology

## 2020-12-19 ENCOUNTER — Ambulatory Visit
Admission: RE | Admit: 2020-12-19 | Discharge: 2020-12-19 | Disposition: A | Discharge status: Home / Self Care | Payer: Self-pay | Attending: Urology | Admitting: Urology

## 2020-12-19 ENCOUNTER — Ambulatory Visit: Payer: Self-pay

## 2020-12-19 DIAGNOSIS — N201 Calculus of ureter: Secondary | ICD-10-CM

## 2020-12-19 DIAGNOSIS — Z79899 Other long term (current) drug therapy: Secondary | ICD-10-CM | POA: Insufficient documentation

## 2020-12-19 DIAGNOSIS — Z87442 Personal history of urinary calculi: Secondary | ICD-10-CM | POA: Insufficient documentation

## 2020-12-19 DIAGNOSIS — Z888 Allergy status to other drugs, medicaments and biological substances status: Secondary | ICD-10-CM | POA: Insufficient documentation

## 2020-12-19 DIAGNOSIS — N200 Calculus of kidney: Secondary | ICD-10-CM

## 2020-12-19 DIAGNOSIS — N132 Hydronephrosis with renal and ureteral calculous obstruction: Secondary | ICD-10-CM | POA: Insufficient documentation

## 2020-12-19 HISTORY — PX: CYSTOSCOPY/URETEROSCOPY/HOLMIUM LASER/STENT PLACEMENT: SHX6546

## 2020-12-19 SURGERY — CYSTOSCOPY/URETEROSCOPY/HOLMIUM LASER/STENT PLACEMENT
Anesthesia: General | Laterality: Right

## 2020-12-19 SURGERY — LITHOTRIPSY, ESWL
Anesthesia: Moderate Sedation | Laterality: Right

## 2020-12-19 MED ORDER — IOPAMIDOL (ISOVUE-200) INJECTION 41%
INTRAVENOUS | Status: DC | PRN
  Administered 2020-12-19: 5 mL

## 2020-12-19 MED ORDER — KETOROLAC TROMETHAMINE 30 MG/ML IJ SOLN
30.0000 mg | Freq: Once | INTRAMUSCULAR | Status: AC
  Administered 2020-12-19: 30 mg via INTRAVENOUS

## 2020-12-19 MED ORDER — DEXMEDETOMIDINE (PRECEDEX) IN NS 20 MCG/5ML (4 MCG/ML) IV SYRINGE
PREFILLED_SYRINGE | INTRAVENOUS | Status: AC
  Filled 2020-12-19: qty 5

## 2020-12-19 MED ORDER — CEFAZOLIN SODIUM-DEXTROSE 2-4 GM/100ML-% IV SOLN
2.0000 g | INTRAVENOUS | Status: AC
  Administered 2020-12-19: 2 g via INTRAVENOUS

## 2020-12-19 MED ORDER — ONDANSETRON HCL 4 MG/2ML IJ SOLN
INTRAMUSCULAR | Status: AC
  Filled 2020-12-19: qty 2

## 2020-12-19 MED ORDER — ACETAMINOPHEN 10 MG/ML IV SOLN
INTRAVENOUS | Status: AC
  Filled 2020-12-19: qty 100

## 2020-12-19 MED ORDER — ACETAMINOPHEN 10 MG/ML IV SOLN
INTRAVENOUS | Status: DC | PRN
  Administered 2020-12-19: 1000 mg via INTRAVENOUS

## 2020-12-19 MED ORDER — ONDANSETRON HCL 4 MG/2ML IJ SOLN: 4.0000 mg | Freq: Once | INTRAMUSCULAR | Status: DC | PRN

## 2020-12-19 MED ORDER — PROPOFOL 10 MG/ML IV BOLUS
INTRAVENOUS | Status: DC | PRN
  Administered 2020-12-19: 200 mg via INTRAVENOUS

## 2020-12-19 MED ORDER — ROCURONIUM BROMIDE 100 MG/10ML IV SOLN
INTRAVENOUS | Status: DC | PRN
  Administered 2020-12-19: 30 mg via INTRAVENOUS
  Administered 2020-12-19 (×3): 10 mg via INTRAVENOUS
  Administered 2020-12-19: 20 mg via INTRAVENOUS

## 2020-12-19 MED ORDER — DEXAMETHASONE SODIUM PHOSPHATE 10 MG/ML IJ SOLN
INTRAMUSCULAR | Status: AC
  Filled 2020-12-19: qty 1

## 2020-12-19 MED ORDER — CHLORHEXIDINE GLUCONATE 0.12 % MT SOLN
15.0000 mL | Freq: Once | OROMUCOSAL | Status: AC
  Administered 2020-12-19: 15 mL via OROMUCOSAL

## 2020-12-19 MED ORDER — EPHEDRINE 5 MG/ML INJ
INTRAVENOUS | Status: AC
  Filled 2020-12-19: qty 10

## 2020-12-19 MED ORDER — OXYCODONE-ACETAMINOPHEN 5-325 MG PO TABS
1.0000 | ORAL_TABLET | ORAL | Status: AC
  Administered 2020-12-19: 1 via ORAL

## 2020-12-19 MED ORDER — CHLORHEXIDINE GLUCONATE 0.12 % MT SOLN
OROMUCOSAL | Status: AC
  Filled 2020-12-19: qty 15

## 2020-12-19 MED ORDER — DIAZEPAM 10 MG PO TABS: 10.0000 mg | ORAL_TABLET | Freq: Once | ORAL | 0 refills | Status: AC

## 2020-12-19 MED ORDER — BELLADONNA ALKALOIDS-OPIUM 16.2-30 MG RE SUPP
1.0000 | Freq: Once | RECTAL | Status: AC
  Administered 2020-12-19: 1 via RECTAL

## 2020-12-19 MED ORDER — MIDAZOLAM HCL 2 MG/2ML IJ SOLN
INTRAMUSCULAR | Status: AC
  Filled 2020-12-19: qty 2

## 2020-12-19 MED ORDER — ONDANSETRON HCL 4 MG/2ML IJ SOLN
INTRAMUSCULAR | Status: DC | PRN
  Administered 2020-12-19: 4 mg via INTRAVENOUS

## 2020-12-19 MED ORDER — FENTANYL CITRATE (PF) 100 MCG/2ML IJ SOLN: 25.0000 ug | INTRAMUSCULAR | Status: DC | PRN

## 2020-12-19 MED ORDER — SUGAMMADEX SODIUM 200 MG/2ML IV SOLN
INTRAVENOUS | Status: DC | PRN
  Administered 2020-12-19: 263 mg via INTRAVENOUS

## 2020-12-19 MED ORDER — LACTATED RINGERS IV SOLN: INTRAVENOUS | Status: DC | PRN

## 2020-12-19 MED ORDER — FENTANYL CITRATE (PF) 100 MCG/2ML IJ SOLN
INTRAMUSCULAR | Status: DC | PRN
  Administered 2020-12-19: 50 ug via INTRAVENOUS
  Administered 2020-12-19 (×2): 25 ug via INTRAVENOUS

## 2020-12-19 MED ORDER — LIDOCAINE HCL (CARDIAC) PF 100 MG/5ML IV SOSY
PREFILLED_SYRINGE | INTRAVENOUS | Status: DC | PRN
  Administered 2020-12-19: 80 mg via INTRAVENOUS

## 2020-12-19 MED ORDER — LIDOCAINE HCL (PF) 2 % IJ SOLN
INTRAMUSCULAR | Status: AC
  Filled 2020-12-19: qty 5

## 2020-12-19 MED ORDER — BELLADONNA ALKALOIDS-OPIUM 16.2-60 MG RE SUPP
RECTAL | Status: AC
  Filled 2020-12-19: qty 1

## 2020-12-19 MED ORDER — MIDAZOLAM HCL 2 MG/2ML IJ SOLN
INTRAMUSCULAR | Status: DC | PRN
  Administered 2020-12-19: 2 mg via INTRAVENOUS

## 2020-12-19 MED ORDER — DEXMEDETOMIDINE (PRECEDEX) IN NS 20 MCG/5ML (4 MCG/ML) IV SYRINGE
PREFILLED_SYRINGE | INTRAVENOUS | Status: DC | PRN
  Administered 2020-12-19: 8 ug via INTRAVENOUS
  Administered 2020-12-19: 12 ug via INTRAVENOUS

## 2020-12-19 MED ORDER — DEXAMETHASONE SODIUM PHOSPHATE 10 MG/ML IJ SOLN
INTRAMUSCULAR | Status: DC | PRN
  Administered 2020-12-19: 5 mg via INTRAVENOUS

## 2020-12-19 MED ORDER — OXYCODONE-ACETAMINOPHEN 5-325 MG PO TABS
ORAL_TABLET | ORAL | Status: AC
  Filled 2020-12-19: qty 1

## 2020-12-19 MED ORDER — OXYBUTYNIN CHLORIDE 5 MG PO TABS: 5.0000 mg | ORAL_TABLET | Freq: Three times a day (TID) | ORAL | 0 refills | Status: DC | PRN

## 2020-12-19 MED ORDER — CEFAZOLIN SODIUM-DEXTROSE 2-4 GM/100ML-% IV SOLN
INTRAVENOUS | Status: AC
  Filled 2020-12-19: qty 100

## 2020-12-19 MED ORDER — PROPOFOL 10 MG/ML IV BOLUS
INTRAVENOUS | Status: AC
  Filled 2020-12-19: qty 20

## 2020-12-19 MED ORDER — KETOROLAC TROMETHAMINE 30 MG/ML IJ SOLN
INTRAMUSCULAR | Status: AC
  Filled 2020-12-19: qty 1

## 2020-12-19 MED ORDER — LACTATED RINGERS IV SOLN: INTRAVENOUS | Status: DC

## 2020-12-19 MED ORDER — ORAL CARE MOUTH RINSE: 15.0000 mL | Freq: Once | OROMUCOSAL | Status: AC

## 2020-12-19 MED ORDER — EPHEDRINE SULFATE 50 MG/ML IJ SOLN
INTRAMUSCULAR | Status: DC | PRN
  Administered 2020-12-19: 15 mg via INTRAVENOUS
  Administered 2020-12-19: 10 mg via INTRAVENOUS
  Administered 2020-12-19: 15 mg via INTRAVENOUS

## 2020-12-19 SURGICAL SUPPLY — 26 items
BAG DRAIN CYSTO-URO LG1000N (MISCELLANEOUS) ×2 IMPLANT
BASKET ZERO TIP 1.9FR (BASKET) ×2 IMPLANT
BRUSH SCRUB EZ 1% IODOPHOR (MISCELLANEOUS) ×2 IMPLANT
CATH URET FLEX-TIP 2 LUMEN 10F (CATHETERS) ×2 IMPLANT
CATH URETL 5X70 OPEN END (CATHETERS) IMPLANT
CNTNR SPEC 2.5X3XGRAD LEK (MISCELLANEOUS)
CONT SPEC 4OZ STER OR WHT (MISCELLANEOUS)
CONTAINER SPEC 2.5X3XGRAD LEK (MISCELLANEOUS) IMPLANT
DRAPE UTILITY 15X26 TOWEL STRL (DRAPES) ×2 IMPLANT
GLOVE SURG ENC MOIS LTX SZ6.5 (GLOVE) ×2 IMPLANT
GOWN STRL REUS W/ TWL LRG LVL3 (GOWN DISPOSABLE) ×2 IMPLANT
GOWN STRL REUS W/TWL LRG LVL3 (GOWN DISPOSABLE) ×2
GUIDEWIRE GREEN .038 145CM (MISCELLANEOUS) ×2 IMPLANT
GUIDEWIRE STR DUAL SENSOR (WIRE) ×2 IMPLANT
INFUSOR MANOMETER BAG 3000ML (MISCELLANEOUS) ×2 IMPLANT
IV NS IRRIG 3000ML ARTHROMATIC (IV SOLUTION) ×2 IMPLANT
KIT TURNOVER CYSTO (KITS) ×2 IMPLANT
MANIFOLD NEPTUNE II (INSTRUMENTS) ×2 IMPLANT
PACK CYSTO AR (MISCELLANEOUS) ×2 IMPLANT
SET CYSTO W/LG BORE CLAMP LF (SET/KITS/TRAYS/PACK) ×2 IMPLANT
SHEATH URETERAL 12FRX35CM (MISCELLANEOUS) ×2 IMPLANT
STENT URET 6FRX24 CONTOUR (STENTS) IMPLANT
STENT URET 6FRX26 CONTOUR (STENTS) ×2 IMPLANT
SURGILUBE 2OZ TUBE FLIPTOP (MISCELLANEOUS) ×2 IMPLANT
TRACTIP FLEXIVA PULSE ID 200 (Laser) ×2 IMPLANT
WATER STERILE IRR 1000ML POUR (IV SOLUTION) ×2 IMPLANT

## 2020-12-19 NOTE — Anesthesia Preprocedure Evaluation (Signed)
Anesthesia Evaluation  Patient identified by MRN, date of birth, ID band Patient awake    Reviewed: Allergy & Precautions, H&P , NPO status , Patient's Chart, lab work & pertinent test results, reviewed documented beta blocker date and time   Airway Mallampati: III  TM Distance: >3 FB Neck ROM: full    Dental  (+) Teeth Intact   Pulmonary neg pulmonary ROS,    Pulmonary exam normal        Cardiovascular Exercise Tolerance: Good negative cardio ROS Normal cardiovascular exam Rhythm:regular Rate:Normal     Neuro/Psych Anxiety negative neurological ROS  negative psych ROS   GI/Hepatic negative GI ROS, Neg liver ROS,   Endo/Other  Morbid obesity  Renal/GU Renal disease  negative genitourinary   Musculoskeletal   Abdominal   Peds  Hematology negative hematology ROS (+)   Anesthesia Other Findings Past Medical History: No date: Kidney stones No date: PTSD (post-traumatic stress disorder) Past Surgical History: 02/2017: FRACTURE SURGERY; Right No date: LITHOTRIPSY; Left BMI    Body Mass Index: 38.26 kg/m     Reproductive/Obstetrics negative OB ROS                             Anesthesia Physical Anesthesia Plan  ASA: III  Anesthesia Plan: General ETT   Post-op Pain Management:    Induction:   PONV Risk Score and Plan:   Airway Management Planned:   Additional Equipment:   Intra-op Plan:   Post-operative Plan:   Informed Consent: I have reviewed the patients History and Physical, chart, labs and discussed the procedure including the risks, benefits and alternatives for the proposed anesthesia with the patient or authorized representative who has indicated his/her understanding and acceptance.     Dental Advisory Given  Plan Discussed with: CRNA  Anesthesia Plan Comments:         Anesthesia Quick Evaluation

## 2020-12-19 NOTE — Discharge Instructions (Signed)
You have a ureteral stent in place.  This is a tube that extends from your kidney to your bladder.  This may cause urinary bleeding, burning with urination, and urinary frequency.  Please call our office or present to the ED if you develop fevers >101 or pain which is not able to be controlled with oral pain medications.  You may be given either Flomax and/ or ditropan to help with bladder spasms and stent pain in addition to pain medications.   ° °[facility] °[address], [address] °[place], [place] [address] °[phone] ° ° ° °AMBULATORY SURGERY  °DISCHARGE INSTRUCTIONS ° ° °1) The drugs that you were given will stay in your system until tomorrow so for the next 24 hours you should not: ° °A) Drive an automobile °B) Make any legal decisions °C) Drink any alcoholic beverage ° ° °2) You may resume regular meals tomorrow.  Today it is better to start with liquids and gradually work up to solid foods. ° °You may eat anything you prefer, but it is better to start with liquids, then soup and crackers, and gradually work up to solid foods. ° ° °3) Please notify your doctor immediately if you have any unusual bleeding, trouble breathing, redness and pain at the surgery site, drainage, fever, or pain not relieved by medication. ° ° ° °4) Additional Instructions: ° ° ° ° ° ° ° °Please contact your physician with any problems or Same Day Surgery at [phone], Monday through Friday 6 am to 4 pm, or [place] Main number at [phone]. °

## 2020-12-19 NOTE — Op Note (Signed)
Date of procedure: 12/19/20  Preoperative diagnosis:  1. Right ureteral calculi x3 2. Right renal stone 3. Right hydronephrosis  Postoperative diagnosis:  1. Same as above  Procedure: 1. Right ureteroscopy with laser lithotripsy 2. Right retrograde pyelogram 3. Right basket extraction of stone fragment 4. Retrieval right ureteral foreign body 5. Right ureteral stent placement 6. Interpretation of fluoroscopy less than 30 minutes  Surgeon: Hollice Espy, MD  Anesthesia: General  Complications: None  Intraoperative findings: Multiple ureteral calculi including fairly impacted right stone at the level of the iliacs, distal ureteral stone and much larger right proximal ureteral stone as well as a nonobstructing stone.  Infundibular stenosis of the midpole calyx.  Malpositioned ureteral stent retracted into distal ureter requiring retrieval.  In the end, stent adequate position and all stones fragmented/obliterated.  EBL: Minimal  Specimens: Stone fragment  Drains: 6 x 26 French double-J ureteral stent on right  Indication: Craig Vargas. is a 50 y.o. patient with multiple right distal ureteral stones as well as nonobstructing stone.  After reviewing the management options for treatment, he elected to proceed with the above surgical procedure(s). We have discussed the potential benefits and risks of the procedure, side effects of the proposed treatment, the likelihood of the patient achieving the goals of the procedure, and any potential problems that might occur during the procedure or recuperation. Informed consent has been obtained.  Description of procedure:  The patient was taken to the operating room and general anesthesia was induced.  The patient was placed in the dorsal lithotomy position, prepped and draped in the usual sterile fashion, and preoperative antibiotics were administered. A preoperative time-out was performed.   A 21 French cystoscope was advanced per  urethra into the bladder.  Attention was turned to the right ureteral orifice.  On scout imaging, at least 2 of the ureteral stones could be seen easily, the proximal ureter as well as at the level of the iliacs.  The distal stone was a little more difficult to see.  A sensor wire was then placed up to the level of the kidney which went easily.  This was snapped in place as a safety wire.  A long semirigid ureteroscope was advanced into the UO with a first stone was encountered in the very distal portion of the ureter.  A 242 m laser fiber was then brought in and using dusting settings of 0.3 J and 60 Hz, the stone was fragmented into tiny pieces.  Most of these pieces passed out of the ureter spontaneously into the bladder.  I then advanced the scope up to the level of the iliacs.  This is a little bit more challenging and the stone was relatively impacted and larger.  I was able to fragment the stone and eventually remove the pieces using 1.9 [place] tipless nitinol basket.  I was not able to advance the scope up beyond this level.  I did inject some contrast to perform a retrograde pyelogram at this point in time which showed no contrast extravasation and no other filling defects in the proximal ureter except for the level of the UPJ where another stone was seen.  Upon backing my scope out, he did have some difficulty removing the scope presumably due to the presence of a fragment closer to the level of the UVJ and ended up having to inject some K-Y jelly through the scope in order to lubricate and remove it.  It was ultimately successfully removed.  I then used  a dual-lumen access sheath to introduce a Super Stiff wire up to the level of the kidney as a working wire.  A Cook 12/14 French ureteral access sheath, 35 cm was advanced to the level of the mid ureter fluoroscopically which went fairly easily.  A flexible digital single channel Wolf ureteroscope was then advanced to the level of the proximal ureter  where the last ureteral stone was encountered.  This was quite large.  A sustained dusting settings to fragment a portion of the stone and the remaining portion retropulsed into the kidney.  There, I treated the remaining stone fragments from the proximal ureteral stone as well as a nonobstructing stone.  Notably, the patient did have fairly narrow infundibulum especially in the midpole calyces through which the scope was barely able to traverse.  Some of the fragments ended up in 1 of these midpole calyces and I spent a good amount of time ensuring that the fragments were extremely small, smaller than the tip of the laser fiber as there was some concern that they would be retained in this dilated calyx with a very stenotic infundibulum.  Finally, a final retrograde pyelogram was performed.  Roadmap of the kidney.  There was no contrast extravasation and each calyx was directly visualized.  There is no significant residual stone burden remaining.  The scope was then backed down the length the ureter inspecting along the way.  Within the distal ureter, there were still a few more fragments which were removed with a basket.  At the level of the iliacs, there is a small mucosal abrasion which appeared to be superficial presumably where the stone was previously impacted.  Finally, a 6 x 26 French double-J ureteral stent was placed fluoroscopically.  Unfortunately, upon removing the wire, the distal portion of the stent retracted into the distal ureter.  I readvanced the cystoscope and directed towards the right ureteral orifice.  A sensor wire was then placed back through the UO and interestingly, ended up cannulating the open end of the ureteral stent within the ureter.  I then readvanced the semirigid ureteroscope up to the level of the stent and by manipulating the wire and scope because able to drive the stent down back to the appropriate level such that a curl was created both within the kidney as well as within  the bladder.  The bladder was then drained.  The patient was then cleaned and dried, repositioned in supine position, reversed from anesthesia, and taken to the PACU in stable condition.  Plan: We will have him return next week for cystoscopy, stent removal.   Hollice Espy, M.D.

## 2020-12-19 NOTE — Interval H&P Note (Signed)
History and Physical Interval Note:  12/19/2020 2:09 PM  Craig Vargas.  has presented today for surgery, with the diagnosis of Ureteral obstruction.  The various methods of treatment have been discussed with the patient and family. After consideration of risks, benefits and other options for treatment, the patient has consented to  Procedure(s): CYSTOSCOPY/URETEROSCOPY/HOLMIUM LASER/STENT PLACEMENT (Right) as a surgical intervention.  The patient's history has been reviewed, patient examined, no change in status, stable for surgery.  I have reviewed the patient's chart and labs.  Questions were answered to the patient's satisfaction.    RRR CTAB  Hollice Espy

## 2020-12-19 NOTE — Anesthesia Postprocedure Evaluation (Signed)
Anesthesia Post Note  Patient: Craig Vargas.  Procedure(s) Performed: CYSTOSCOPY/URETEROSCOPY/HOLMIUM LASER/STENT PLACEMENT (Right )  Patient location during evaluation: PACU Anesthesia Type: General Level of consciousness: awake and alert Pain management: pain level controlled Vital Signs Assessment: post-procedure vital signs reviewed and stable Respiratory status: spontaneous breathing and respiratory function stable Cardiovascular status: stable Anesthetic complications: no   No complications documented.   Last Vitals:  Vitals:   12/19/20 1305 12/19/20 1645  BP: (!) 160/109 (!) 146/83  Pulse: 80 85  Resp: 18 (!) 23  Temp: 36.7 C   SpO2: 97% 100%    Last Pain:  Vitals:   12/19/20 1645  TempSrc:   PainSc: 0-No pain                 Lala Been K

## 2020-12-19 NOTE — Transfer of Care (Signed)
Immediate Anesthesia Transfer of Care Note  Patient: Craig Vargas.  Procedure(s) Performed: CYSTOSCOPY/URETEROSCOPY/HOLMIUM LASER/STENT PLACEMENT (Right )  Patient Location: PACU  Anesthesia Type:General  Level of Consciousness: awake, alert  and oriented  Airway & Oxygen Therapy: Patient Spontanous Breathing and Patient connected to face mask oxygen  Post-op Assessment: Report given to RN and Post -op Vital signs reviewed and stable  Post vital signs: Reviewed and stable  Last Vitals:  Vitals Value Taken Time  BP    Temp    Pulse    Resp    SpO2      Last Pain:  Vitals:   12/19/20 1305  TempSrc: Oral  PainSc: 0-No pain         Complications: No complications documented.

## 2020-12-19 NOTE — Anesthesia Procedure Notes (Signed)
Procedure Name: Intubation Date/Time: 12/19/2020 2:52 PM Performed by: Philbert Riser, CRNA Pre-anesthesia Checklist: Patient identified, Emergency Drugs available, Suction available and Patient being monitored Patient Re-evaluated:Patient Re-evaluated prior to induction Oxygen Delivery Method: Circle system utilized Preoxygenation: Pre-oxygenation with 100% oxygen Induction Type: IV induction Ventilation: Mask ventilation without difficulty Grade View: Grade III Tube type: Oral Tube size: 7.5 mm Number of attempts: 2 Airway Equipment and Method: Stylet and Oral airway Placement Confirmation: ETT inserted through vocal cords under direct vision,  positive ETCO2 and breath sounds checked- equal and bilateral Tube secured with: Tape Dental Injury: Teeth and Oropharynx as per pre-operative assessment

## 2020-12-20 ENCOUNTER — Encounter: Payer: Self-pay | Admitting: Urology

## 2020-12-20 LAB — URINALYSIS, COMPLETE
Bilirubin, UA: NEGATIVE
Glucose, UA: NEGATIVE
Ketones, UA: NEGATIVE
Leukocytes,UA: NEGATIVE
Nitrite, UA: NEGATIVE
Protein,UA: NEGATIVE
Specific Gravity, UA: 1.02 (ref 1.005–1.030)
Urobilinogen, Ur: 1 mg/dL (ref 0.2–1.0)
pH, UA: 5.5 (ref 5.0–7.5)

## 2020-12-20 LAB — MICROSCOPIC EXAMINATION: Bacteria, UA: NONE SEEN

## 2020-12-24 ENCOUNTER — Ambulatory Visit (INDEPENDENT_AMBULATORY_CARE_PROVIDER_SITE_OTHER): Payer: Self-pay | Admitting: Urology

## 2020-12-24 ENCOUNTER — Other Ambulatory Visit: Payer: Self-pay

## 2020-12-24 ENCOUNTER — Encounter: Payer: Self-pay | Admitting: Urology

## 2020-12-24 VITALS — BP 133/95 | HR 76 | Ht 73.0 in | Wt 310.0 lb

## 2020-12-24 DIAGNOSIS — N200 Calculus of kidney: Secondary | ICD-10-CM

## 2020-12-24 LAB — URINALYSIS, COMPLETE
Bilirubin, UA: NEGATIVE
Glucose, UA: NEGATIVE
Ketones, UA: NEGATIVE
Nitrite, UA: NEGATIVE
Specific Gravity, UA: 1.015 (ref 1.005–1.030)
Urobilinogen, Ur: 1 mg/dL (ref 0.2–1.0)
pH, UA: 7 (ref 5.0–7.5)

## 2020-12-24 LAB — CALCULI, WITH PHOTOGRAPH (CLINICAL LAB)
Calcium Oxalate Monohydrate: 100 %
Weight Calculi: 2 mg

## 2020-12-24 LAB — MICROSCOPIC EXAMINATION
Bacteria, UA: NONE SEEN
RBC, Urine: >30 /hpf — ABNORMAL HIGH (ref 0–2)

## 2020-12-24 MED ORDER — SULFAMETHOXAZOLE-TRIMETHOPRIM 800-160 MG PO TABS
1.0000 | ORAL_TABLET | Freq: Once | ORAL | Status: AC
  Administered 2020-12-24: 1 via ORAL

## 2020-12-24 NOTE — Progress Notes (Signed)
   12/24/20  CC:  Chief Complaint  Patient presents with  . Cysto Stent Removal    HPI: 50 year old male with multiple ureteral calculi as well as nonobstructing stones who returns today for cystoscopy, stent removal.  He is premedicated today with Valium due to anxiety.  He had no issues with the stent other than some pressure in his kidney with voiding.  Urinalysis today is consistent with known stent without concern for infection.  He is given a periprocedural Bactrim today.  Cystoscopy/ Stent removal procedure  Patient identification was confirmed, informed consent was obtained, and patient was prepped using Betadine solution.  Lidocaine jelly was administered per urethral meatus.    Preoperative abx where received prior to procedure.    Procedure: - Flexible cystoscope introduced, without any difficulty.   - Thorough search of the bladder revealed:    normal urethral meatus  Stent seen emanating from right ureteral orifice, grasped with stent graspers, and removed in entirety.     Post-Procedure: - Patient tolerated the procedure well   Assessment/ Plan:  1. Kidney stones Status post uncomplicated right ureteroscopy for multiple ureteral and kidney renal calculi  Warning symptoms reviewed  Him follow-up in 1 month renal ultrasound prior - Urinalysis, Complete - sulfamethoxazole-trimethoprim (BACTRIM DS) 800-160 MG per tablet 1 tablet - RUS  Hollice Espy, MD

## 2020-12-25 ENCOUNTER — Other Ambulatory Visit: Payer: Self-pay

## 2020-12-25 MED ORDER — OXYCODONE-ACETAMINOPHEN 5-325 MG PO TABS: 2.0000 | ORAL_TABLET | Freq: Four times a day (QID) | ORAL | 0 refills | Status: DC | PRN

## 2020-12-25 NOTE — Telephone Encounter (Signed)
Patient called stating he had severe pain post stent removal yesterday. 4 hours last night of uncontrollable pain and vomiting, he describes similar to stone passage. He is better this morning with taking his last tablet of percocet. He is also utilizing oxybutinin and tamsulosin and increasing fluid intake. Patient unable to come to clinic today for Toradol injection, no ride available. Requested refill on pain medication

## 2020-12-30 ENCOUNTER — Telehealth: Payer: Self-pay

## 2020-12-30 MED ORDER — TAMSULOSIN HCL 0.4 MG PO CAPS: ORAL_CAPSULE | ORAL | 0 refills | Status: DC

## 2020-12-30 NOTE — Telephone Encounter (Signed)
Patient called stating that post stent removal he has been having a lot of flank pain and stone fragments passing. He has stopped taking Percocet due to side effects and is taking OTC pain medication when needed. He is out of Flomax and is requesting a refill since he is still passing fragments. He describes a severe pain episode yesterday evening lasting over 4 hours. Patient used a stim machine on kidney area to try and break up fragments in kidney he felt that he might have a blockage. He noted a release after about an hour of stimulation and was then able to sleep. This morning he passed a large amount of fragments and is doing much better. Flomax was refilled and patient was encouraged to keep utilizing OTC pain medication as needed and increased water intake. Keep follow up as scheduled Dr. Erlene Quan was verbally notified of patient's status update

## 2021-01-15 ENCOUNTER — Telehealth: Payer: Self-pay | Admitting: *Deleted

## 2021-01-15 ENCOUNTER — Ambulatory Visit: Payer: Self-pay

## 2021-01-15 NOTE — Telephone Encounter (Signed)
Called patient to inform the need for renal ultrasound prior to follow up with Dr. Erlene Quan. Patient canceled ultrasound-states he has too many medical bills and no insurance does not want to follow up or have any more expenses due to imaging.

## 2021-01-21 ENCOUNTER — Ambulatory Visit: Payer: Self-pay | Admitting: Urology

## 2021-01-22 ENCOUNTER — Other Ambulatory Visit: Payer: Self-pay | Admitting: Urology

## 2021-02-06 ENCOUNTER — Other Ambulatory Visit: Payer: Self-pay | Admitting: Urology

## 2021-06-09 ENCOUNTER — Ambulatory Visit: Payer: Self-pay | Admitting: *Deleted

## 2021-06-09 NOTE — Telephone Encounter (Signed)
Pt called in to re-establish care with Dr. Otilio Miu with [facility].   He lost his insurance and recently got it again.   He saw Dr. Ronnald Ramp many years ago before losing insurance.  I scheduled him with Dr. Ronnald Ramp for September 19, 2021 to establish as a new pt.   In the mean time I referred him to the urgent care so he could restart his BP medication since his BP is elevated and he is having headaches.   He was agreeable to this then mentioned he has a tele visit program he can do with his new insurance.   He is going to call them first and if that doesn't work out for getting an rx for his BP medication he will go to the urgent care.    He has been off the losartan for 6-7 months now.

## 2021-06-09 NOTE — Telephone Encounter (Signed)
Reason for Disposition  Systolic BP  >= 826 OR Diastolic >= 415    Referred to Urgent Care because not established with PCP until March 2023  Answer Assessment - Initial Assessment Questions 1. BLOOD PRESSURE: "What is the blood pressure?" "Did you take at least two measurements 5 minutes apart?"     I just recently got insurance.  This morning 159/100.  2 days ago it was higher.   I'm getting headaches.   I need a new dr.   I didn't have insurance.   I saw Dr. Ronnald Ramp many years ago before I lost my insurance. 2. ONSET: "When did you take your blood pressure?"     This morning 3. HOW: "How did you obtain the blood pressure?" (e.g., visiting nurse, automatic home BP monitor)     Home monitor 4. HISTORY: "Do you have a history of high blood pressure?"     Yes 5. MEDICATIONS: "Are you taking any medications for blood pressure?" "Have you missed any doses recently?"     Losartan 5 mg  I lost my insurance last year.   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   170/110 2 days ago 6. OTHER SYMPTOMS: "Do you have any symptoms?" (e.g., headache, chest pain, blurred vision, difficulty breathing, weakness)     Headaches.   7. PREGNANCY: "Is there any chance you are pregnant?" "When was your last menstrual period?"     N/A  Protocols used: Blood Pressure - High-A-AH

## 2021-06-10 ENCOUNTER — Other Ambulatory Visit: Payer: Self-pay

## 2021-06-10 ENCOUNTER — Ambulatory Visit
Admission: EM | Admit: 2021-06-10 | Discharge: 2021-06-10 | Disposition: A | Discharge status: Home / Self Care | Payer: 59 | Attending: Physician Assistant | Admitting: Physician Assistant

## 2021-06-10 DIAGNOSIS — I10 Essential (primary) hypertension: Secondary | ICD-10-CM | POA: Diagnosis not present

## 2021-06-10 DIAGNOSIS — E785 Hyperlipidemia, unspecified: Secondary | ICD-10-CM | POA: Insufficient documentation

## 2021-06-10 DIAGNOSIS — R739 Hyperglycemia, unspecified: Secondary | ICD-10-CM | POA: Insufficient documentation

## 2021-06-10 DIAGNOSIS — Z760 Encounter for issue of repeat prescription: Secondary | ICD-10-CM | POA: Diagnosis not present

## 2021-06-10 HISTORY — DX: Essential (primary) hypertension: I10

## 2021-06-10 LAB — LIPID PANEL
Cholesterol: 193 mg/dL (ref 0–200)
HDL: 32 mg/dL — ABNORMAL LOW (ref >40)
LDL Cholesterol: 117 mg/dL — ABNORMAL HIGH (ref 0–99)
Total CHOL/HDL Ratio: 6 RATIO
Triglycerides: 219 mg/dL — ABNORMAL HIGH (ref <150)
VLDL: 44 mg/dL — ABNORMAL HIGH (ref 0–40)

## 2021-06-10 LAB — COMPREHENSIVE METABOLIC PANEL
ALT: 50 U/L — ABNORMAL HIGH (ref 0–44)
AST: 60 U/L — ABNORMAL HIGH (ref 15–41)
Albumin: 4.5 g/dL (ref 3.5–5.0)
Alkaline Phosphatase: 73 U/L (ref 38–126)
Anion gap: 10 (ref 5–15)
BUN: 20 mg/dL (ref 6–20)
CO2: 24 mmol/L (ref 22–32)
Calcium: 9.3 mg/dL (ref 8.9–10.3)
Chloride: 105 mmol/L (ref 98–111)
Creatinine, Ser: 1 mg/dL (ref 0.61–1.24)
GFR, Estimated: >60 mL/min (ref >60)
Glucose, Bld: 139 mg/dL — ABNORMAL HIGH (ref 70–99)
Potassium: 4.4 mmol/L (ref 3.5–5.1)
Sodium: 139 mmol/L (ref 135–145)
Total Bilirubin: 1 mg/dL (ref 0.3–1.2)
Total Protein: 7.9 g/dL (ref 6.5–8.1)

## 2021-06-10 LAB — CBC WITH DIFFERENTIAL/PLATELET
Abs Immature Granulocytes: 0.01 10*3/uL (ref 0.00–0.07)
Basophils Absolute: 0.1 10*3/uL (ref 0.0–0.1)
Basophils Relative: 1 %
Eosinophils Absolute: 0.1 10*3/uL (ref 0.0–0.5)
Eosinophils Relative: 3 %
HCT: 49.5 % (ref 39.0–52.0)
Hemoglobin: 16.6 g/dL (ref 13.0–17.0)
Immature Granulocytes: 0 %
Lymphocytes Relative: 30 %
Lymphs Abs: 1.3 10*3/uL (ref 0.7–4.0)
MCH: 29.7 pg (ref 26.0–34.0)
MCHC: 33.5 g/dL (ref 30.0–36.0)
MCV: 88.6 fL (ref 80.0–100.0)
Monocytes Absolute: 0.4 10*3/uL (ref 0.1–1.0)
Monocytes Relative: 8 %
Neutro Abs: 2.6 10*3/uL (ref 1.7–7.7)
Neutrophils Relative %: 58 %
Platelets: 221 10*3/uL (ref 150–400)
RBC: 5.59 MIL/uL (ref 4.22–5.81)
RDW: 12 % (ref 11.5–15.5)
WBC: 4.5 10*3/uL (ref 4.0–10.5)
nRBC: 0 % (ref 0.0–0.2)

## 2021-06-10 MED ORDER — ROSUVASTATIN CALCIUM 10 MG PO TABS: 10.0000 mg | ORAL_TABLET | Freq: Every day | ORAL | 3 refills | Status: DC

## 2021-06-10 MED ORDER — LOSARTAN POTASSIUM 25 MG PO TABS: 25.0000 mg | ORAL_TABLET | Freq: Every day | ORAL | 3 refills | Status: DC

## 2021-06-10 NOTE — Discharge Instructions (Signed)
-  We have drawn some labs today.  I will call you with the results.  I will have the results of the CBC and CMP today but the cholesterol panel may be back tomorrow or the next day.  Someone will contact her with the results. - I have refilled your hypertension and hyperlipidemia medications to get you to your appointment.  Make sure you keep your appointment as we cannot continue to provide refills. - Make sure to get 20 to 30 minutes of exercise a day, drink plenty of water, reduce dietary sodium and try your best to follow a healthy eating plan. - Keep a log of your blood pressures so that you can take it to your visit with your PCP. - Go to ER if you ever have any chest pain, shortness of breath, palpitations, swelling of your legs, severe headaches or dizziness.

## 2021-06-10 NOTE — ED Triage Notes (Addendum)
Pt states he is off his blood pressure medications x one year. He reports his blood pressure has been elevated and was told to come here for medication until he can be seen. Also needs his statin medication. States he "feels good" and is without complaints. His appt for PCP is March 2023. Meds are Losartan 50mg  and Rosuvastatin 10mg 

## 2021-06-10 NOTE — ED Provider Notes (Signed)
[facility]    CSN: [id number] Arrival date & time: 06/10/21  3267      History   Chief Complaint Chief Complaint  Patient presents with   Medication Refill   Hypertension    HPI Craig Vargas. is a 50 y.o. male with history of hypertension, hyperlipidemia, PTSD and nephrolithiasis.  Patient presents today for refill of losartan and Crestor.  Patient reports being off of these medications for about a year since losing insurance and not seeing his PCP.  Reports he has felt well recently.  States that he started a new diet and exercise plan.  Reports that he has been checking his blood pressure and it has been elevated in the 124P to 809X systolic over 83J to 825K diastolic.  Patient says he had a headache last week but other than that he has not had any headaches, dizziness, vision changes, chest pain, palpitations, shortness of breath, weakness, fatigue, leg swelling.  Patient reports that he does have an appointment with a PCP on 09/19/2020.  Reports he contacted the office about getting his medications but they suggested that he go to urgent care to be prescribed a supply of the medication until he can see them in their office.  Patient denies any alcohol use and does not smoke tobacco.  He states that his PTSD is well controlled with duloxetine and buspirone.  Reports that he has plenty of these medications to get to the appointment he believes.  No other complaints today.  HPI  Past Medical History:  Diagnosis Date   Hypertension    Kidney stones    PTSD (post-traumatic stress disorder)     There are no problems to display for this patient.   Past Surgical History:  Procedure Laterality Date   CYSTOSCOPY/URETEROSCOPY/HOLMIUM LASER/STENT PLACEMENT Right 12/19/2020   Procedure: CYSTOSCOPY/URETEROSCOPY/HOLMIUM LASER/STENT PLACEMENT;  Surgeon: Hollice Espy, MD;  Location: [facility] ORS;  Service: Urology;  Laterality: Right;   FRACTURE SURGERY Right 02/2017    LITHOTRIPSY Left        Home Medications    Prior to Admission medications   Medication Sig Start Date End Date Taking? Authorizing Provider  losartan (COZAAR) 25 MG tablet Take 1 tablet (25 mg total) by mouth daily. 06/10/21 07/10/21 Yes Laurene Footman B, PA-C  rosuvastatin (CRESTOR) 10 MG tablet Take 1 tablet (10 mg total) by mouth daily. 06/10/21 07/10/21 Yes Laurene Footman B, PA-C  busPIRone (BUSPAR) 5 MG tablet Take 5 mg by mouth 2 (two) times daily.    [provider]  diazepam (VALIUM) 10 MG tablet Take 10 mg by mouth once. 12/19/20   [provider]  DULoxetine (CYMBALTA) 30 MG capsule 30 mg 2 (two) times daily. 02/20/18   [provider]    Family History Family History  Problem Relation Age of Onset   Cancer Other    Diabetes Other     Social History Social History   Tobacco Use   Smoking status: Never    Passive exposure: Never   Smokeless tobacco: Never  Vaping Use   Vaping Use: Never used  Substance Use Topics   Alcohol use: Yes    Comment: occasional   Drug use: No     Allergies   Patient has no known allergies.   Review of Systems Review of Systems  Constitutional:  Negative for fatigue.  Respiratory:  Negative for chest tightness, shortness of breath and wheezing.   Cardiovascular:  Negative for chest pain, palpitations and leg swelling.  Neurological:  Negative for dizziness, weakness and headaches.  Psychiatric/Behavioral:  The patient is nervous/anxious (History of PTSD).     Physical Exam Triage Vital Signs ED Triage Vitals  Enc Vitals Group     BP 06/10/21 0849 (!) 153/110     Pulse Rate 06/10/21 0849 73     Resp 06/10/21 0849 19     Temp 06/10/21 0849 98.1 F (36.7 C)     Temp Source 06/10/21 0849 Oral     SpO2 06/10/21 0849 99 %     Weight 06/10/21 0844 290 lb (131.5 kg)     Height 06/10/21 0844 6\' 1"  (1.854 m)     Head Circumference --      Peak Flow --      Pain Score 06/10/21 0844 0     Pain Loc --       Pain Edu? --      Excl. in [place]? --    No data found.  Updated Vital Signs BP (!) 153/110 (BP Location: Left Arm)   Pulse 73   Temp 98.1 F (36.7 C) (Oral)   Resp 19   Ht 6\' 1"  (1.854 m)   Wt 290 lb (131.5 kg)   SpO2 99%   BMI 38.26 kg/m      Physical Exam Vitals and nursing note reviewed.  Constitutional:      General: He is not in acute distress.    Appearance: Normal appearance. He is well-developed. He is obese. He is not ill-appearing.  HENT:     Head: Normocephalic and atraumatic.  Eyes:     General: No scleral icterus.    Conjunctiva/sclera: Conjunctivae normal.  Cardiovascular:     Rate and Rhythm: Normal rate and regular rhythm.     Heart sounds: Normal heart sounds.  Pulmonary:     Effort: Pulmonary effort is normal. No respiratory distress.     Breath sounds: Normal breath sounds.  Musculoskeletal:        General: No swelling.     Cervical back: Neck supple.  Skin:    General: Skin is warm and dry.     Capillary Refill: Capillary refill takes less than 2 seconds.  Neurological:     General: No focal deficit present.     Mental Status: He is alert. Mental status is at baseline.     Motor: No weakness.     Coordination: Coordination normal.     Gait: Gait normal.  Psychiatric:        Mood and Affect: Mood normal.        Behavior: Behavior normal.        Thought Content: Thought content normal.     UC Treatments / Results  Labs (all labs ordered are listed, but only abnormal results are displayed) Labs Reviewed  COMPREHENSIVE METABOLIC PANEL - Abnormal; Notable for the following components:      Result Value   Glucose, Bld 139 (*)    AST 60 (*)    ALT 50 (*)    All other components within normal limits  CBC WITH DIFFERENTIAL/PLATELET  LIPID PANEL    EKG   Radiology No results found.  Procedures Procedures (including critical care time)  Medications Ordered in UC Medications - No data to display  Initial Impression / Assessment and  Plan / UC Course  I have reviewed the triage vital signs and the nursing notes.  Pertinent labs & imaging results that were available during my care of the patient were reviewed  by me and considered in my medical decision making (see chart for details).  50 year old male presenting for refills of losartan and Crestor.  Patient has history of hypertension and hyperlipidemia.  Has not seen a PCP in over a year.  Reports previously taking losartan Crestor and doing well with those medications.  BP currently 153/110.  The remainder the vital signs are normal and stable.  Patient is overall well-appearing.  Exam is within normal limits today.  Heart regular rate and rhythm and chest clear to auscultation.  No leg edema/swelling.  CBC, CMP and lipid panel obtained.  Patient says he has not eaten anything today.  Advised patient I will call him with the results.  I have refilled the losartan and Crestor.  Advised to keep a log of his blood pressure so he can follow-up with PCP regarding this.  ED precautions for hypertension reviewed with patient.  Advised to follow-up here as needed.  Discussed lab results with patient.  Normal CBC.  CMP shows elevated glucose at 139 and slightly elevated LFTs.  Discussed with patient that he needs to have an A1c checked and also have his liver enzymes rechecked when he sees his PCP.  Advised him to be really good about watching his sugar and carb intake and trying to increase exercise and lose weight.  Final Clinical Impressions(s) / UC Diagnoses   Final diagnoses:  Medication refill  Essential hypertension  Hyperlipidemia, unspecified hyperlipidemia type  Hyperglycemia     Discharge Instructions      -We have drawn some labs today.  I will call you with the results.  I will have the results of the CBC and CMP today but the cholesterol panel may be back tomorrow or the next day.  Someone will contact her with the results. - I have refilled your hypertension and  hyperlipidemia medications to get you to your appointment.  Make sure you keep your appointment as we cannot continue to provide refills. - Make sure to get 20 to 30 minutes of exercise a day, drink plenty of water, reduce dietary sodium and try your best to follow a healthy eating plan. - Keep a log of your blood pressures so that you can take it to your visit with your PCP. - Go to ER if you ever have any chest pain, shortness of breath, palpitations, swelling of your legs, severe headaches or dizziness.     ED Prescriptions     Medication Sig Dispense Auth. Provider   losartan (COZAAR) 25 MG tablet Take 1 tablet (25 mg total) by mouth daily. 30 tablet Laurene Footman B, PA-C   rosuvastatin (CRESTOR) 10 MG tablet Take 1 tablet (10 mg total) by mouth daily. 30 tablet Gretta Cool      PDMP not reviewed this encounter.   Danton Clap, PA-C 06/10/21 1027

## 2021-09-19 ENCOUNTER — Ambulatory Visit (INDEPENDENT_AMBULATORY_CARE_PROVIDER_SITE_OTHER): Payer: 59 | Admitting: Family Medicine

## 2021-09-19 ENCOUNTER — Other Ambulatory Visit: Payer: Self-pay

## 2021-09-19 ENCOUNTER — Encounter: Payer: Self-pay | Admitting: Family Medicine

## 2021-09-19 VITALS — BP 142/102 | HR 72 | Ht 73.0 in | Wt 311.0 lb

## 2021-09-19 DIAGNOSIS — F32A Depression, unspecified: Secondary | ICD-10-CM | POA: Diagnosis not present

## 2021-09-19 DIAGNOSIS — E7801 Familial hypercholesterolemia: Secondary | ICD-10-CM | POA: Diagnosis not present

## 2021-09-19 DIAGNOSIS — Z23 Encounter for immunization: Secondary | ICD-10-CM | POA: Diagnosis not present

## 2021-09-19 DIAGNOSIS — F419 Anxiety disorder, unspecified: Secondary | ICD-10-CM | POA: Diagnosis not present

## 2021-09-19 DIAGNOSIS — I10 Essential (primary) hypertension: Secondary | ICD-10-CM

## 2021-09-19 DIAGNOSIS — Z1211 Encounter for screening for malignant neoplasm of colon: Secondary | ICD-10-CM

## 2021-09-19 MED ORDER — LOSARTAN POTASSIUM 50 MG PO TABS: 50.0000 mg | ORAL_TABLET | Freq: Every day | ORAL | 1 refills | Status: DC

## 2021-09-19 MED ORDER — BUSPIRONE HCL 5 MG PO TABS: 5.0000 mg | ORAL_TABLET | Freq: Two times a day (BID) | ORAL | 1 refills | Status: DC

## 2021-09-19 MED ORDER — DULOXETINE HCL 30 MG PO CPEP: 30.0000 mg | ORAL_CAPSULE | Freq: Two times a day (BID) | ORAL | 1 refills | Status: DC

## 2021-09-19 NOTE — Progress Notes (Signed)
Date:  09/19/2021   Name:  Craig Vargas.   DOB:  February 01, 1971   MRN:  [id number]   Chief Complaint: Establish Care and Colon Cancer Screening  Patient is a 51 year old male who presents for an establish care exam. The patient reports the following problems: as noted. Health maintenance has been reviewed up to date.    Hypertension This is a chronic problem. The problem has been waxing and waning since onset. The problem is controlled. Associated symptoms include anxiety. Pertinent negatives include no chest pain, headaches, malaise/fatigue, neck pain, palpitations, peripheral edema or shortness of breath. Risk factors for coronary artery disease include dyslipidemia. Past treatments include angiotensin blockers. The current treatment provides moderate improvement. There are no compliance problems.  There is no history of angina, kidney disease, CAD/MI, CVA, heart failure, left ventricular hypertrophy, PVD or retinopathy. There is no history of chronic renal disease, a hypertension causing med or renovascular disease.  Depression      The patient presents with depression.  This is a chronic problem.  The current episode started more than 1 year ago.   Associated symptoms include no myalgias, no headaches and no suicidal ideas.  Past treatments include SNRIs - Serotonin and norepinephrine reuptake inhibitors.  Previous treatment provided moderate relief.  Past medical history includes anxiety and depression.   Anxiety Presents for follow-up visit. Patient reports no chest pain, depressed mood, dizziness, feeling of choking, irritability, malaise, nausea, nervous/anxious behavior, palpitations, panic, shortness of breath or suicidal ideas.   His past medical history is significant for depression.  Hyperlipidemia This is a chronic problem. The problem is uncontrolled. Recent lipid tests were reviewed and are variable. He has no history of chronic renal disease. Pertinent negatives include no  chest pain, myalgias or shortness of breath. The current treatment provides moderate improvement of lipids. There are no compliance problems.  Risk factors for coronary artery disease include dyslipidemia and hypertension.   Lab Results  Component Value Date   NA 139 06/10/2021   K 4.4 06/10/2021   CO2 24 06/10/2021   GLUCOSE 139 (H) 06/10/2021   BUN 20 06/10/2021   CREATININE 1.00 06/10/2021   CALCIUM 9.3 06/10/2021   GFRNONAA >60 06/10/2021   Lab Results  Component Value Date   CHOL 193 06/10/2021   HDL 32 (L) 06/10/2021   LDLCALC 117 (H) 06/10/2021   TRIG 219 (H) 06/10/2021   CHOLHDL 6.0 06/10/2021   No results found for: TSH No results found for: HGBA1C Lab Results  Component Value Date   WBC 4.5 06/10/2021   HGB 16.6 06/10/2021   HCT 49.5 06/10/2021   MCV 88.6 06/10/2021   PLT 221 06/10/2021   Lab Results  Component Value Date   ALT 50 (H) 06/10/2021   AST 60 (H) 06/10/2021   ALKPHOS 73 06/10/2021   BILITOT 1.0 06/10/2021   No results found for: 25OHVITD2, 25OHVITD3, VD25OH   Review of Systems  Constitutional:  Negative for chills, fever, irritability and malaise/fatigue.  HENT:  Negative for drooling, ear discharge, ear pain and sore throat.   Respiratory:  Negative for cough, shortness of breath and wheezing.   Cardiovascular:  Negative for chest pain, palpitations and leg swelling.  Gastrointestinal:  Negative for abdominal pain, blood in stool, constipation, diarrhea and nausea.  Endocrine: Negative for polydipsia.  Genitourinary:  Negative for dysuria, frequency, hematuria and urgency.  Musculoskeletal:  Negative for back pain, myalgias and neck pain.  Skin:  Negative for rash.  Allergic/Immunologic: Negative for environmental allergies.  Neurological:  Negative for dizziness and headaches.  Hematological:  Does not bruise/bleed easily.  Psychiatric/Behavioral:  Positive for depression. Negative for suicidal ideas. The patient is not nervous/anxious.     There are no problems to display for this patient.   No Known Allergies  Past Surgical History:  Procedure Laterality Date   CYSTOSCOPY/URETEROSCOPY/HOLMIUM LASER/STENT PLACEMENT Right 12/19/2020   Procedure: CYSTOSCOPY/URETEROSCOPY/HOLMIUM LASER/STENT PLACEMENT;  Surgeon: Hollice Espy, MD;  Location: [facility] ORS;  Service: Urology;  Laterality: Right;   FRACTURE SURGERY Right 02/2017   LITHOTRIPSY Left     Social History   Tobacco Use   Smoking status: Never    Passive exposure: Never   Smokeless tobacco: Never  Vaping Use   Vaping Use: Never used  Substance Use Topics   Alcohol use: Yes    Comment: occasional   Drug use: No     Medication list has been reviewed and updated.  Current Meds  Medication Sig   busPIRone (BUSPAR) 5 MG tablet Take 5 mg by mouth 2 (two) times daily.   DULoxetine (CYMBALTA) 30 MG capsule 30 mg 2 (two) times daily.   losartan (COZAAR) 25 MG tablet Take 1 tablet (25 mg total) by mouth daily.    PHQ 2/9 Scores 09/19/2021  PHQ - 2 Score 0  PHQ- 9 Score 0    GAD 7 : Generalized Anxiety Score 09/19/2021  Nervous, Anxious, on Edge 0  Control/stop worrying 0  Worry too much - different things 0  Trouble relaxing 0  Restless 0  Easily annoyed or irritable 0  Afraid - awful might happen 0  Total GAD 7 Score 0  Anxiety Difficulty Not difficult at all    BP Readings from Last 3 Encounters:  09/19/21 (!) 140/100  06/10/21 (!) 153/110  12/24/20 (!) 133/95    Physical Exam Vitals and nursing note reviewed.  HENT:     Head: Normocephalic.     Right Ear: Tympanic membrane and external ear normal.     Left Ear: Tympanic membrane and external ear normal.     Nose: Nose normal.  Eyes:     General: No scleral icterus.       Right eye: No discharge.        Left eye: No discharge.     Conjunctiva/sclera: Conjunctivae normal.     Pupils: Pupils are equal, round, and reactive to light.  Neck:     Thyroid: No thyromegaly.     Vascular: No JVD.      Trachea: No tracheal deviation.  Cardiovascular:     Rate and Rhythm: Normal rate and regular rhythm.     Chest Wall: No thrill.     Heart sounds: Normal heart sounds, S1 normal and S2 normal. No murmur heard. No systolic murmur is present.  No diastolic murmur is present.    No friction rub. No gallop. No S3 or S4 sounds.  Pulmonary:     Effort: No respiratory distress.     Breath sounds: Normal breath sounds. No wheezing or rales.  Abdominal:     General: Bowel sounds are normal.     Palpations: Abdomen is soft. There is no mass.     Tenderness: There is no abdominal tenderness. There is no guarding or rebound.  Musculoskeletal:        General: No tenderness. Normal range of motion.     Cervical back: Normal range of motion and neck supple.  Lymphadenopathy:  Cervical: No cervical adenopathy.  Neurological:     Mental Status: He is alert.     Deep Tendon Reflexes: Reflexes are normal and symmetric.    Wt Readings from Last 3 Encounters:  09/19/21 (!) 311 lb (141.1 kg)  06/10/21 290 lb (131.5 kg)  12/24/20 (!) 310 lb (140.6 kg)    BP (!) 140/100    Pulse 72    Ht 6\' 1"  (1.854 m)    Wt (!) 311 lb (141.1 kg)    BMI 41.03 kg/m   Assessment and Plan: Patient establishing care with new physician. 1. Primary hypertension Chronic.  Uncontrolled.  Stable.  Blood pressure 140/100.  We will increase losartan to 50 mg a day and will check renal function panel. - Renal Function Panel - losartan (COZAAR) 50 MG tablet; Take 1 tablet (50 mg total) by mouth daily.  Dispense: 90 tablet; Refill: 1  2. Familial hypercholesterolemia Chronic.  Controlled.  Stable.  Patient is unable to tolerate statins/rosuvastatin 10 mg.  We will check lipid panel and consider whether we maintain on diet or initiate Zetia might - Lipid Panel With LDL/HDL Ratio  3. Depression, unspecified depression type Chronic.  Controlled.  Stable.  PHQ is 0.  Continue duloxetine 30 mg 1 capsule twice a day. -  DULoxetine (CYMBALTA) 30 MG capsule; Take 1 capsule (30 mg total) by mouth 2 (two) times daily.  Dispense: 180 capsule; Refill: 1  4. Anxiety Chronic.  Controlled.  Stable.  Continue buspirone 5 mg 1 twice a day. - busPIRone (BUSPAR) 5 MG tablet; Take 1 tablet (5 mg total) by mouth 2 (two) times daily.  Dispense: 180 tablet; Refill: 1  5. Need for immunization against influenza Discussed and administered. - Flu Vaccine QUAD 70mo+IM (Fluarix, Fluzone & Alfiuria Quad PF)  6. Colon cancer screening Discussion with patient and he agrees to for FIT testing. - Fecal occult blood, imunochemical([facility])

## 2021-09-19 NOTE — Patient Instructions (Signed)

## 2021-09-20 LAB — RENAL FUNCTION PANEL
Albumin: 5 g/dL — ABNORMAL HIGH (ref 3.8–4.9)
BUN/Creatinine Ratio: 16 (ref 9–20)
BUN: 16 mg/dL (ref 6–24)
CO2: 26 mmol/L (ref 20–29)
Calcium: 9.9 mg/dL (ref 8.7–10.2)
Chloride: 103 mmol/L (ref 96–106)
Creatinine, Ser: 0.99 mg/dL (ref 0.76–1.27)
Glucose: 116 mg/dL — ABNORMAL HIGH (ref 70–99)
Phosphorus: 3.3 mg/dL (ref 2.8–4.1)
Potassium: 4.4 mmol/L (ref 3.5–5.2)
Sodium: 143 mmol/L (ref 134–144)
eGFR: 92 mL/min/{1.73_m2} (ref >59)

## 2021-09-20 LAB — LIPID PANEL WITH LDL/HDL RATIO
Cholesterol, Total: 235 mg/dL — ABNORMAL HIGH (ref 100–199)
HDL: 30 mg/dL — ABNORMAL LOW (ref >39)
LDL Chol Calc (NIH): 139 mg/dL — ABNORMAL HIGH (ref 0–99)
LDL/HDL Ratio: 4.6 ratio — ABNORMAL HIGH (ref 0.0–3.6)
Triglycerides: 363 mg/dL — ABNORMAL HIGH (ref 0–149)
VLDL Cholesterol Cal: 66 mg/dL — ABNORMAL HIGH (ref 5–40)

## 2021-09-24 ENCOUNTER — Other Ambulatory Visit: Payer: Self-pay

## 2021-09-24 DIAGNOSIS — N200 Calculus of kidney: Secondary | ICD-10-CM | POA: Insufficient documentation

## 2021-09-24 DIAGNOSIS — R195 Other fecal abnormalities: Secondary | ICD-10-CM

## 2021-09-24 DIAGNOSIS — G4700 Insomnia, unspecified: Secondary | ICD-10-CM | POA: Insufficient documentation

## 2021-09-24 DIAGNOSIS — Z1211 Encounter for screening for malignant neoplasm of colon: Secondary | ICD-10-CM

## 2021-09-24 DIAGNOSIS — J302 Other seasonal allergic rhinitis: Secondary | ICD-10-CM | POA: Insufficient documentation

## 2021-09-24 LAB — FECAL OCCULT BLOOD, IMMUNOCHEMICAL: Fecal Occult Bld: POSITIVE — AB

## 2021-09-24 MED ORDER — NA SULFATE-K SULFATE-MG SULF 17.5-3.13-1.6 GM/177ML PO SOLN: 1.0000 | Freq: Once | ORAL | 0 refills | Status: AC

## 2021-09-24 NOTE — Progress Notes (Signed)
Gastroenterology Pre-Procedure Review ? ?Request Date: 10/14/2021 ?Requesting Physician: Dr. Marius Ditch ? ?PATIENT REVIEW QUESTIONS: The patient responded to the following health history questions as indicated:   ? ?1. Are you having any GI issues? no ?2. Do you have a personal history of Polyps? no ?3. Do you have a family history of Colon Cancer or Polyps? no ?4. Diabetes Mellitus? no ?5. Joint replacements in the past 12 months?no ?6. Major health problems in the past 3 months?no ?7. Any artificial heart valves, MVP, or defibrillator?no ?   ?MEDICATIONS & ALLERGIES:    ?Patient reports the following regarding taking any anticoagulation/antiplatelet therapy:   ?Plavix, Coumadin, Eliquis, Xarelto, Lovenox, Pradaxa, Brilinta, or Effient? no ?Aspirin? no ? ?Patient confirms/reports the following medications:  ?Current Outpatient Medications  ?Medication Sig Dispense Refill  ? busPIRone (BUSPAR) 5 MG tablet Take 1 tablet (5 mg total) by mouth 2 (two) times daily. 180 tablet 1  ? DULoxetine (CYMBALTA) 30 MG capsule Take 1 capsule (30 mg total) by mouth 2 (two) times daily. 180 capsule 1  ? losartan (COZAAR) 50 MG tablet Take 1 tablet (50 mg total) by mouth daily. 90 tablet 1  ? ?No current facility-administered medications for this visit.  ? ? ?Patient confirms/reports the following allergies:  ?No Known Allergies ? ?No orders of the defined types were placed in this encounter. ? ? ?AUTHORIZATION INFORMATION ?Primary Insurance: ?1D#: ?Group #: ? ?Secondary Insurance: ?1D#: ?Group #: ? ?SCHEDULE INFORMATION: ?Date: 10/14/2021 ?Time: ?Location: [place] ? ?

## 2021-09-30 ENCOUNTER — Telehealth: Payer: Self-pay | Admitting: Family Medicine

## 2021-09-30 NOTE — Telephone Encounter (Signed)
Copied from [place] [id number]. Topic: General - Other ?>> Sep 30, 2021 10:16 AM Leward Quan A wrote: ?Reason for CRM: Patient called in to inquire of Dr Ronnald Ramp about the Colonoscopy he is scheduled for say that it is coded as a diagnostic and they are charging him $7000 and say if it is coded as screening it will be a lot less. Say that appointment is scheduled and need these changes please ASAP he can be reached at  Ph# [phone] ?

## 2021-10-02 ENCOUNTER — Telehealth: Payer: Self-pay

## 2021-10-02 ENCOUNTER — Telehealth: Payer: Self-pay | Admitting: Family Medicine

## 2021-10-02 NOTE — Telephone Encounter (Signed)
I returned pt's call concerning diagnostic coding for colonoscopy- I explained to him that once there is a problem such as blood in the stool (on the FIT test), it changes from a screening to a diagnostic colonoscopy. Pt stated he will cancel it and hope for next year.  ?

## 2021-10-02 NOTE — Telephone Encounter (Signed)
Patient called in and stated that it would be $7,000 if he did a colonoscopy under Diagnostics. Patient stated it needed to be coded as a Screening. Patient stated he will not have this done if it is coded as Diagnostics because he can not pay that amount out of pocket. Patient would like a call back if this can be corrected.  ?

## 2021-10-03 ENCOUNTER — Telehealth: Payer: Self-pay | Admitting: Gastroenterology

## 2021-10-03 NOTE — Telephone Encounter (Signed)
Patient would like to cancel colonoscopy. Patient states he will wait until next next. Cannot afford to pay. ?

## 2021-10-06 NOTE — Telephone Encounter (Signed)
Patient requested to cancel procedure. Craig Vargas has been notified of change.  ?

## 2021-10-07 ENCOUNTER — Encounter: Payer: Self-pay | Admitting: Family Medicine

## 2021-10-07 ENCOUNTER — Other Ambulatory Visit: Payer: Self-pay

## 2021-10-07 ENCOUNTER — Ambulatory Visit (INDEPENDENT_AMBULATORY_CARE_PROVIDER_SITE_OTHER): Payer: 59 | Admitting: Family Medicine

## 2021-10-07 VITALS — BP 138/86 | HR 80 | Ht 73.0 in | Wt 312.0 lb

## 2021-10-07 DIAGNOSIS — I10 Essential (primary) hypertension: Secondary | ICD-10-CM

## 2021-10-07 DIAGNOSIS — E782 Mixed hyperlipidemia: Secondary | ICD-10-CM | POA: Diagnosis not present

## 2021-10-07 MED ORDER — LOSARTAN POTASSIUM 50 MG PO TABS: 50.0000 mg | ORAL_TABLET | Freq: Every day | ORAL | 1 refills | Status: DC

## 2021-10-07 NOTE — Progress Notes (Addendum)
? ? ?Date:  10/07/2021  ? ?Name:  Craig Vargas.   DOB:  1970-12-18   MRN:  [id number] ? ? ?Chief Complaint: Hypertension (Recheck bp) and Hyperlipidemia (Something to replace statin) ? ?Hypertension ?This is a chronic problem. The current episode started more than 1 year ago. The problem has been gradually improving since onset. The problem is controlled. Pertinent negatives include no anxiety, blurred vision, chest pain, headaches, malaise/fatigue, neck pain, orthopnea, palpitations, peripheral edema, PND, shortness of breath or sweats. Risk factors for coronary artery disease include dyslipidemia. Past treatments include ACE inhibitors. The current treatment provides moderate improvement. There are no compliance problems.  There is no history of angina, kidney disease, CAD/MI, CVA, heart failure, left ventricular hypertrophy, PVD or retinopathy. There is no history of chronic renal disease, a hypertension causing med or renovascular disease.  ?Hyperlipidemia ?This is a chronic problem. The current episode started more than 1 year ago. The problem is controlled. Recent lipid tests were reviewed and are normal. He has no history of chronic renal disease, diabetes, hypothyroidism, liver disease, obesity or nephrotic syndrome. Pertinent negatives include no chest pain, focal sensory loss, focal weakness, leg pain, myalgias or shortness of breath. Current antihyperlipidemic treatment includes diet change. The current treatment provides moderate improvement of lipids. There are no compliance problems.   ? ?Lab Results  ?Component Value Date  ? NA 143 09/19/2021  ? K 4.4 09/19/2021  ? CO2 26 09/19/2021  ? GLUCOSE 116 (H) 09/19/2021  ? BUN 16 09/19/2021  ? CREATININE 0.99 09/19/2021  ? CALCIUM 9.9 09/19/2021  ? EGFR 92 09/19/2021  ? GFRNONAA >60 06/10/2021  ? ?Lab Results  ?Component Value Date  ? CHOL 235 (H) 09/19/2021  ? HDL 30 (L) 09/19/2021  ? LDLCALC 139 (H) 09/19/2021  ? TRIG 363 (H) 09/19/2021  ? CHOLHDL  6.0 06/10/2021  ? ?No results found for: TSH ?No results found for: HGBA1C ?Lab Results  ?Component Value Date  ? WBC 4.5 06/10/2021  ? HGB 16.6 06/10/2021  ? HCT 49.5 06/10/2021  ? MCV 88.6 06/10/2021  ? PLT 221 06/10/2021  ? ?Lab Results  ?Component Value Date  ? ALT 50 (H) 06/10/2021  ? AST 60 (H) 06/10/2021  ? ALKPHOS 73 06/10/2021  ? BILITOT 1.0 06/10/2021  ? ?No results found for: 25OHVITD2, [place], VD25OH  ? ?Review of Systems  ?Constitutional:  Negative for chills, fever and malaise/fatigue.  ?HENT:  Negative for drooling, ear discharge, ear pain and sore throat.   ?Eyes:  Negative for blurred vision.  ?Respiratory:  Negative for cough, shortness of breath and wheezing.   ?Cardiovascular:  Negative for chest pain, palpitations, orthopnea, leg swelling and PND.  ?Gastrointestinal:  Negative for abdominal pain, blood in stool, constipation, diarrhea and nausea.  ?Endocrine: Negative for polydipsia.  ?Genitourinary:  Negative for dysuria, frequency, hematuria and urgency.  ?Musculoskeletal:  Negative for back pain, myalgias and neck pain.  ?Skin:  Negative for rash.  ?Allergic/Immunologic: Negative for environmental allergies.  ?Neurological:  Negative for dizziness, focal weakness and headaches.  ?Hematological:  Does not bruise/bleed easily.  ?Psychiatric/Behavioral:  Negative for suicidal ideas. The patient is not nervous/anxious.   ? ?Patient Active Problem List  ? Diagnosis Date Noted  ? Allergic rhinitis, seasonal 09/24/2021  ? Insomnia 09/24/2021  ? Nephrolithiasis 09/24/2021  ? 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 Retinal detachment, left 05/22/2020  ? Screening for colorectal cancer 01/12/2020  ? OSA (obstructive sleep apnea) 06/23/2017  ? Anger reaction  05/03/2017  ? Anxiety 05/03/2017  ? Closed dislocation of carpometacarpal joint of wrist 01/15/2017  ? PTSD (post-traumatic stress disorder) 08/21/2016  ? ? ?Allergies  ?Allergen Reactions  ? Statins   ?  Leg aches/ pains   ? ? ?Past Surgical History:  ?Procedure Laterality Date  ? CYSTOSCOPY/URETEROSCOPY/HOLMIUM LASER/STENT PLACEMENT Right 12/19/2020  ? Procedure: CYSTOSCOPY/URETEROSCOPY/HOLMIUM LASER/STENT PLACEMENT;  Surgeon: Hollice Espy, MD;  Location: [facility] OR[facility];  Service: Urology;  Laterality: Right;  ? FRACTURE SURGERY Right 02/2017  ? LITHOTRIPSY Left   ? ? ?Social History  ? ?Tobacco Use  ? Smoking status: Never  ?  Passive exposure: Never  ? Smokeless tobacco: Never  ?Vaping Use  ? Vaping Use: Never used  ?Substance Use Topics  ? Alcohol use: Yes  ?  Comment: occasional  ? Drug use: No  ? ? ? ?Medication list has been reviewed and updated. ? ?Current Meds  ?Medication Sig  ? busPIRone (BUSPAR) 5 MG tablet Take 1 tablet (5 mg total) by mouth 2 (two) times daily.  ? DULoxetine (CYMBALTA) 30 MG capsule Take 1 capsule (30 mg total) by mouth 2 (two) times daily.  ? losartan (COZAAR) 50 MG tablet Take 1 tablet (50 mg total) by mouth daily.  ? ? ?PHQ 2/9 Scores 09/19/2021  ?PHQ - 2 Score 0  ?PHQ- 9 Score 0  ? ? ?GAD 7 : Generalized Anxiety Score 09/19/2021  ?Nervous, Anxious, on Edge 0  ?Control/stop worrying 0  ?Worry too much - different things 0  ?Trouble relaxing 0  ?Restless 0  ?Easily annoyed or irritable 0  ?Afraid - awful might happen 0  ?Total GAD 7 Score 0  ?Anxiety Difficulty Not difficult at all  ? ? ?BP Readings from Last 3 Encounters:  ?10/07/21 138/86  ?09/19/21 (!) 142/102  ?06/10/21 (!) 153/110  ? ? ?Physical Exam ?Vitals and nursing note reviewed.  ?HENT:  ?   Head: Normocephalic.  ?   Right Ear: Tympanic membrane, ear canal and external ear normal.  ?   Left Ear: Tympanic membrane, ear canal and external ear normal.  ?   Nose: Nose normal. No congestion or rhinorrhea.  ?Eyes:  ?   General: No scleral icterus.    ?   Right eye: No discharge.     ?   Left eye: No discharge.  ?   Conjunctiva/sclera: Conjunctivae normal.  ?   Pupils: Pupils are equal, round, and reactive to light.  ?Neck:  ?   Thyroid: No thyromegaly.  ?    Vascular: No JVD.  ?   Trachea: No tracheal deviation.  ?Cardiovascular:  ?   Rate and Rhythm: Normal rate and regular rhythm.  ?   Heart sounds: Normal heart sounds. No murmur heard. ?  No friction rub. No gallop.  ?Pulmonary:  ?   Effort: No respiratory distress.  ?   Breath sounds: Normal breath sounds. No wheezing, rhonchi or rales.  ?Abdominal:  ?   General: Bowel sounds are normal.  ?   Palpations: Abdomen is soft. There is no mass.  ?   Tenderness: There is no abdominal tenderness. There is no guarding or rebound.  ?Musculoskeletal:     ?   General: No tenderness. Normal range of motion.  ?   Cervical back: Normal range of motion and neck supple.  ?Lymphadenopathy:  ?   Cervical: No cervical adenopathy.  ?Skin: ?   General: Skin is warm.  ?   Findings: No rash.  ?Neurological:  ?  Mental Status: He is alert and oriented to person, place, and time.  ?   Cranial Nerves: No cranial nerve deficit.  ?   Deep Tendon Reflexes: Reflexes are normal and symmetric.  ? ? ?Wt Readings from Last 3 Encounters:  ?10/07/21 (!) 312 lb (141.5 kg)  ?09/19/21 (!) 311 lb (141.1 kg)  ?06/10/21 290 lb (131.5 kg)  ? ? ?BP 138/86   Pulse 80   Ht _0  (1.854 m)   Wt (!) 312 lb (141.5 kg)   BMI 41.16 kg/m?  ? ?Assessment and Plan: ? ?1. Primary hypertension ?Chronic.  Controlled.  Stable.  Continue losartan 50 mg once a day.  We will recheck in 6 months. ?- losartan (COZAAR) 50 MG tablet; Take 1 tablet (50 mg total) by mouth daily.  Dispense: 90 tablet; Refill: 1 ? ?2. Mixed hyperlipidemia ?Chronic.  Controlled.  Stable.  We will continue with dietary control.  Was mildly elevated 6 months ago somewhat elevated more so most recent.  We will work on reemphasized the dietary approach and will recheck in 6 months. ? ?With a positive fit test patient prefers to wait until a another cycle in which he has a free screening procedure and would like to do his colonoscopy at that time.  Informed the patient that is his choice but we will  approach the GI department and reinquire as to what other options there are if any.                 Discussion with GI and we have been given instructions as to what to convey to the patient.  I have recommende

## 2021-10-07 NOTE — Patient Instructions (Signed)
Calorie Counting for Weight Loss ?Calories are units of energy. Your body needs a certain number of calories from food to keep going throughout the day. When you eat or drink more calories than your body needs, your body stores the extra calories mostly as fat. When you eat or drink fewer calories than your body needs, your body burns fat to get the energy it needs. ?Calorie counting means keeping track of how many calories you eat and drink each day. Calorie counting can be helpful if you need to lose weight. If you eat fewer calories than your body needs, you should lose weight. Ask your health care provider what a healthy weight is for you. ?For calorie counting to work, you will need to eat the right number of calories each day to lose a healthy amount of weight per week. A dietitian can help you figure out how many calories you need in a day and will suggest ways to reach your calorie goal. ?A healthy amount of weight to lose each week is usually 1-2 lb (0.5-0.9 kg). This usually means that your daily calorie intake should be reduced by 500-750 calories. ?Eating 1,200-1,500 calories a day can help most women lose weight. ?Eating 1,500-1,800 calories a day can help most men lose weight. ?What do I need to know about calorie counting? ?Work with your health care provider or dietitian to determine how many calories you should get each day. To meet your daily calorie goal, you will need to: ?Find out how many calories are in each food that you would like to eat. Try to do this before you eat. ?Decide how much of the food you plan to eat. ?Keep a food log. Do this by writing down what you ate and how many calories it had. ?To successfully lose weight, it is important to balance calorie counting with a healthy lifestyle that includes regular activity. ?Where do I find calorie information? ?The number of calories in a food can be found on a Nutrition Facts label. If a food does not have a Nutrition Facts label, try to  look up the calories online or ask your dietitian for help. ?Remember that calories are listed per serving. If you choose to have more than one serving of a food, you will have to multiply the calories per serving by the number of servings you plan to eat. For example, the label on a package of bread might say that a serving size is 1 slice and that there are 90 calories in a serving. If you eat 1 slice, you will have eaten 90 calories. If you eat 2 slices, you will have eaten 180 calories. ?How do I keep a food log? ?After each time that you eat, record the following in your food log as soon as possible: ?What you ate. Be sure to include toppings, sauces, and other extras on the food. ?How much you ate. This can be measured in cups, ounces, or number of items. ?How many calories were in each food and drink. ?The total number of calories in the food you ate. ?Keep your food log near you, such as in a pocket-sized notebook or on an app or website on your mobile phone. Some programs will calculate calories for you and show you how many calories you have left to meet your daily goal. ?What are some portion-control tips? ?Know how many calories are in a serving. This will help you know how many servings you can have of a certain food. ?  Use a measuring cup to measure serving sizes. You could also try weighing out portions on a kitchen scale. With time, you will be able to estimate serving sizes for some foods. ?Take time to put servings of different foods on your favorite plates or in your favorite bowls and cups so you know what a serving looks like. ?Try not to eat straight from a food's packaging, such as from a bag or box. Eating straight from the package makes it hard to see how much you are eating and can lead to overeating. Put the amount you would like to eat in a cup or on a plate to make sure you are eating the right portion. ?Use smaller plates, glasses, and bowls for smaller portions and to prevent  overeating. ?Try not to multitask. For example, avoid watching TV or using your computer while eating. If it is time to eat, sit down at a table and enjoy your food. This will help you recognize when you are full. It will also help you be more mindful of what and how much you are eating. ?What are tips for following this plan? ?Reading food labels ?Check the calorie count compared with the serving size. The serving size may be smaller than what you are used to eating. ?Check the source of the calories. Try to choose foods that are high in protein, fiber, and vitamins, and low in saturated fat, trans fat, and sodium. ?Shopping ?Read nutrition labels while you shop. This will help you make healthy decisions about which foods to buy. ?Pay attention to nutrition labels for low-fat or fat-free foods. These foods sometimes have the same number of calories or more calories than the full-fat versions. They also often have added sugar, starch, or salt to make up for flavor that was removed with the fat. ?Make a grocery list of lower-calorie foods and stick to it. ?Cooking ?Try to cook your favorite foods in a healthier way. For example, try baking instead of frying. ?Use low-fat dairy products. ?Meal planning ?Use more fruits and vegetables. One-half of your plate should be fruits and vegetables. ?Include lean proteins, such as chicken, [place], and fish. ?Lifestyle ?Each week, aim to do one of the following: ?150 minutes of moderate exercise, such as walking. ?75 minutes of vigorous exercise, such as running. ?General information ?Know how many calories are in the foods you eat most often. This will help you calculate calorie counts faster. ?Find a way of tracking calories that works for you. Get creative. Try different apps or programs if writing down calories does not work for you. ?What foods should I eat? ? ?Eat nutritious foods. It is better to have a nutritious, high-calorie food, such as an avocado, than a food with  few nutrients, such as a bag of potato chips. ?Use your calories on foods and drinks that will fill you up and will not leave you hungry soon after eating. ?Examples of foods that fill you up are nuts and nut butters, vegetables, lean proteins, and high-fiber foods such as whole grains. High-fiber foods are foods with more than 5 g of fiber per serving. ?Pay attention to calories in drinks. Low-calorie drinks include water and unsweetened drinks. ?The items listed above may not be a complete list of foods and beverages you can eat. Contact a dietitian for more information. ?What foods should I limit? ?Limit foods or drinks that are not good sources of vitamins, minerals, or protein or that are high in unhealthy fats. These include: ?  Candy. ?Other sweets. ?Sodas, specialty coffee drinks, alcohol, and juice. ?The items listed above may not be a complete list of foods and beverages you should avoid. Contact a dietitian for more information. ?How do I count calories when eating out? ?Pay attention to portions. Often, portions are much larger when eating out. Try these tips to keep portions smaller: ?Consider sharing a meal instead of getting your own. ?If you get your own meal, eat only half of it. Before you start eating, ask for a container and put half of your meal into it. ?When available, consider ordering smaller portions from the menu instead of full portions. ?Pay attention to your food and drink choices. Knowing the way food is cooked and what is included with the meal can help you eat fewer calories. ?If calories are listed on the menu, choose the lower-calorie options. ?Choose dishes that include vegetables, fruits, whole grains, low-fat dairy products, and lean proteins. ?Choose items that are boiled, broiled, grilled, or steamed. Avoid items that are buttered, battered, fried, or served with cream sauce. Items labeled as crispy are usually fried, unless stated otherwise. ?Choose water, low-fat milk,  unsweetened iced tea, or other drinks without added sugar. If you want an alcoholic beverage, choose a lower-calorie option, such as a glass of wine or light beer. ?Ask for dressings, sauces, and syrups on the side. T

## 2021-10-10 ENCOUNTER — Telehealth: Payer: Self-pay

## 2021-10-10 NOTE — Telephone Encounter (Signed)
Pt left a message with the [facility] stating he wanted [place] to change the dx code of his colonoscopy order to screening rather than diagnostic so his insurance will pay more towards it.  Spoke to Dr Ronnald Ramp who state the pt had a positive FIT test which is why he needs the diagnostic colonscopy.  Left a vm for the patient explaining that we could not change the dx code due to the positive test.  GI has also explained that to him as well.  Informed the pt on the vm that he will need to speak with the doctor about his plan further if he refuses to do the diagnostic colonoscopy. ?

## 2021-10-14 ENCOUNTER — Ambulatory Visit: Admission: RE | Admit: 2021-10-14 | Payer: 59 | Source: Home / Self Care | Admitting: Gastroenterology

## 2021-10-14 ENCOUNTER — Encounter: Admission: RE | Payer: Self-pay | Source: Home / Self Care

## 2021-10-14 SURGERY — COLONOSCOPY WITH PROPOFOL
Anesthesia: Choice

## 2021-11-25 ENCOUNTER — Ambulatory Visit
Admission: EM | Admit: 2021-11-25 | Discharge: 2021-11-25 | Disposition: A | Discharge status: Home / Self Care | Payer: 59 | Attending: Physician Assistant | Admitting: Physician Assistant

## 2021-11-25 DIAGNOSIS — M791 Myalgia, unspecified site: Secondary | ICD-10-CM | POA: Insufficient documentation

## 2021-11-25 DIAGNOSIS — S60861A Insect bite (nonvenomous) of right wrist, initial encounter: Secondary | ICD-10-CM | POA: Insufficient documentation

## 2021-11-25 DIAGNOSIS — Z20822 Contact with and (suspected) exposure to covid-19: Secondary | ICD-10-CM | POA: Insufficient documentation

## 2021-11-25 DIAGNOSIS — R519 Headache, unspecified: Secondary | ICD-10-CM | POA: Diagnosis present

## 2021-11-25 DIAGNOSIS — W57XXXA Bitten or stung by nonvenomous insect and other nonvenomous arthropods, initial encounter: Secondary | ICD-10-CM | POA: Insufficient documentation

## 2021-11-25 DIAGNOSIS — S60561A Insect bite (nonvenomous) of right hand, initial encounter: Secondary | ICD-10-CM | POA: Diagnosis not present

## 2021-11-25 LAB — RESP PANEL BY RT-PCR (FLU A&B, COVID) ARPGX2
Influenza A by PCR: NEGATIVE
Influenza B by PCR: NEGATIVE
SARS Coronavirus 2 by RT PCR: NEGATIVE

## 2021-11-25 NOTE — ED Triage Notes (Signed)
Pt brought his mother her who was diagnosed with Pneumonia and covid. ? ?Pt was bit by a spider twice along the right palm and wrist on Saturday and 2 ticks within the last month on the back. ? ?Pt is concerned about possible [place] spotted fever or lyme disease.  ? ?Pt took 2 home covid tests and they were negative 2 days ago. Pt asks for another covid test.  ?

## 2021-11-25 NOTE — Discharge Instructions (Signed)
-  COVID test is negative.  If you were to develop cough, congestion or other upper respiratory symptoms, retest yourself at home and isolate 5 days from symptom onset.  Mask x5 days after that. ?- We are testing for Lyme and [facility] spotted fever given your tick exposure.  We will call with the results.  If positive we will treat you.  If negative and you continue to feel bad after the next couple of weeks I would see your PCP and discuss retesting you for tick diseases. ?- Increase rest and fluids and take Tylenol as needed for aches. ?- Go to ER for any severe acute worsening of your symptoms. ?

## 2021-11-25 NOTE — ED Provider Notes (Signed)
[facility]    CSN: [id number] Arrival date & time: 11/25/21  4034      History   Chief Complaint Chief Complaint  Patient presents with   Generalized Body Aches   Covid Exposure   Insect Bite    HPI Craig Gazzola. is a 51 y.o. male presenting for diffuse body aches and headache for the past 3 to 4 days.  Patient denies any associated fever, joint swelling, rashes, cough, congestion, sore throat, chest pain, breathing occultly, abdominal pain, nausea/vomiting or diarrhea.  Patient was exposed to COVID-19 in the past couple of weeks through his mother.  Patient also reports he was bit by some insect last week.  He thinks it could have been a spider but he did not see the insect.  He has a couple healing bumps on his right hand and wrist.  Denies associated pain in this region.  Patient reports that his wife reminded him that he was bit by 2 ticks in the past 1 month.  He says one of the ticks did look like a "[place] tick."  Patient says he has a history of "tick fever" in the past.  He says symptoms feel similar and he is concerned about that possibility.  Has taken two home COVID test and they were both negative but would like another test.  No other complaints.  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   Retinal detachment, left 05/22/2020   Screening for colorectal cancer 01/12/2020   OSA (obstructive sleep apnea) 06/23/2017   Anger reaction 05/03/2017   Anxiety 05/03/2017   Closed dislocation of carpometacarpal joint of wrist 01/15/2017   PTSD (post-traumatic stress disorder) 08/21/2016    Past Surgical History:  Procedure Laterality Date   CYSTOSCOPY/URETEROSCOPY/HOLMIUM  LASER/STENT PLACEMENT Right 12/19/2020   Procedure: CYSTOSCOPY/URETEROSCOPY/HOLMIUM LASER/STENT PLACEMENT;  Surgeon: Vanna Scotland, MD;  Location: [facility] ORS;  Service: Urology;  Laterality: Right;   FRACTURE SURGERY Right 02/2017   LITHOTRIPSY Left        Home Medications    Prior to Admission medications   Medication Sig Start Date End Date Taking? Authorizing Provider  busPIRone (BUSPAR) 5 MG tablet Take 1 tablet (5 mg total) by mouth 2 (two) times daily. 09/19/21  Yes Duanne Limerick, MD  DULoxetine (CYMBALTA) 30 MG capsule Take 1 capsule (30 mg total) by mouth 2 (two) times daily. 09/19/21  Yes Duanne Limerick, MD  losartan (COZAAR) 50 MG tablet Take 1 tablet (50 mg total) by mouth daily. 10/07/21  Yes Duanne Limerick, MD    Family History Family History  Problem Relation Age of Onset   Cancer Other    Diabetes Other     Social History Social History   Tobacco Use   Smoking status: Never    Passive exposure: Never   Smokeless tobacco: Never  Vaping Use   Vaping Use: Never used  Substance Use Topics   Alcohol use: Yes    Comment: occasional   Drug use: No     Allergies   Statins   Review of Systems Review of Systems  Constitutional:  Negative for fatigue and fever.  HENT:  Negative for congestion, rhinorrhea, sinus  pressure, sinus pain and sore throat.   Eyes:  Positive for photophobia.  Respiratory:  Negative for cough and shortness of breath.   Gastrointestinal:  Negative for abdominal pain, diarrhea, nausea and vomiting.  Musculoskeletal:  Positive for arthralgias and myalgias.  Skin:  Negative for rash.  Neurological:  Positive for headaches. Negative for weakness and light-headedness.  Hematological:  Negative for adenopathy.    Physical Exam Triage Vital Signs ED Triage Vitals  Enc Vitals Group     BP 11/25/21 0835 (!) 160/106     Pulse Rate 11/25/21 0835 96     Resp 11/25/21 0835 18     Temp 11/25/21 0835 98.4 F (36.9 C)     Temp Source 11/25/21  0835 Oral     SpO2 11/25/21 0835 98 %     Weight 11/25/21 0834 300 lb (136.1 kg)     Height 11/25/21 0834 6\' 1"  (1.854 m)     Head Circumference --      Peak Flow --      Pain Score 11/25/21 0834 5     Pain Loc --      Pain Edu? --      Excl. in GC? --    No data found.  Updated Vital Signs BP (!) 160/106 (BP Location: Left Arm)   Pulse 96   Temp 98.4 F (36.9 C) (Oral)   Resp 18   Ht 6\' 1"  (1.854 m)   Wt 300 lb (136.1 kg)   SpO2 98%   BMI 39.58 kg/m   Physical Exam Vitals and nursing note reviewed.  Constitutional:      General: He is not in acute distress.    Appearance: Normal appearance. He is well-developed. He is not ill-appearing.  HENT:     Head: Normocephalic and atraumatic.     Nose: Nose normal.     Mouth/Throat:     Mouth: Mucous membranes are moist.     Pharynx: Oropharynx is clear.  Eyes:     General: No scleral icterus.    Conjunctiva/sclera: Conjunctivae normal.  Cardiovascular:     Rate and Rhythm: Normal rate and regular rhythm.     Heart sounds: Normal heart sounds.  Pulmonary:     Effort: Pulmonary effort is normal. No respiratory distress.     Breath sounds: Normal breath sounds.  Musculoskeletal:     Cervical back: Neck supple.  Skin:    General: Skin is warm and dry.     Capillary Refill: Capillary refill takes less than 2 seconds.     Findings: Rash (3-4 healing erythematous papules with overlying scab of right hand/wrist) present.  Neurological:     General: No focal deficit present.     Mental Status: He is alert. Mental status is at baseline.     Motor: No weakness.     Coordination: Coordination normal.     Gait: Gait normal.  Psychiatric:        Mood and Affect: Mood normal.        Behavior: Behavior normal.        Thought Content: Thought content normal.     UC Treatments / Results  Labs (all labs ordered are listed, but only abnormal results are displayed) Labs Reviewed  RESP PANEL BY RT-PCR (FLU A&B, COVID) [id number]   LYME DISEASE SEROLOGY W/REFLEX  ROCKY MTN SPOTTED FVR ABS PNL(IGG+IGM)    EKG   Radiology No results found.  Procedures Procedures (including critical care time)  Medications Ordered in  UC Medications - No data to display  Initial Impression / Assessment and Plan / UC Course  I have reviewed the triage vital signs and the nursing notes.  Pertinent labs & imaging results that were available during my care of the patient were reviewed by me and considered in my medical decision making (see chart for details).  51 year old male presenting for myalgias/arthralgias and headache for the past 3 to 4 days.  COVID exposure.  Also reports recent history of tick bites in the past 1 month.  No associated URI symptoms.  No associated rash with the tick bites or fevers.  Patient is afebrile and overall well-appearing.  Exam only significant for 3-4 erythematous healing papules of the right hand/wrist where he says he thinks she was bit by a spider last week.  Did not see a spider.  Areas do not appear to be infected.  The remainder the exam is normal.  Respiratory panel obtained.  Negative flu and COVID.  Discussed results with patient.  We did discuss testing for tick diseases if the COVID test was negative.  Labs obtained for Lyme and recommend spotted fever.  Advised on x-ray results.  We will treat if test is positive.  Advised if negative to follow-up with PCP and discuss getting retested in the next couple of weeks or having further work-up.  At this time, symptoms could be related to other viral illness versus the insect bites from last week.  Reviewed rest, fluids, Tylenol Motrin as needed for aches and pains and going to emergency department if symptoms were to worsen.   Final Clinical Impressions(s) / UC Diagnoses   Final diagnoses:  Acute nonintractable headache, unspecified headache type  Myalgia  Close exposure to COVID-19 virus  Tick bite, unspecified site, initial encounter      Discharge Instructions      -COVID test is negative.  If you were to develop cough, congestion or other upper respiratory symptoms, retest yourself at home and isolate 5 days from symptom onset.  Mask x5 days after that. - We are testing for Lyme and [facility] spotted fever given your tick exposure.  We will call with the results.  If positive we will treat you.  If negative and you continue to feel bad after the next couple of weeks I would see your PCP and discuss retesting you for tick diseases. - Increase rest and fluids and take Tylenol as needed for aches. - Go to ER for any severe acute worsening of your symptoms.     ED Prescriptions   None    PDMP not reviewed this encounter.   Shirlee Latch, PA-C 11/25/21 [phone]

## 2021-11-26 LAB — LYME DISEASE SEROLOGY W/REFLEX: Lyme Total Antibody EIA: UNDETERMINED

## 2021-11-26 LAB — LYME IGG/IGM
Lyme IgG EIA: NEGATIVE
Lyme IgM EIA: NEGATIVE

## 2021-11-27 LAB — ROCKY MTN SPOTTED FVR ABS PNL(IGG+IGM)
RMSF IgG: POSITIVE — AB
RMSF IgM: 0.75 index (ref 0.00–0.89)

## 2021-11-27 LAB — RMSF, IGG, IFA: RMSF, IGG, IFA: <1:64 {titer}

## 2021-11-28 ENCOUNTER — Ambulatory Visit: Payer: Self-pay

## 2021-11-28 ENCOUNTER — Telehealth: Payer: Self-pay | Admitting: Physician Assistant

## 2021-11-28 MED ORDER — DOXYCYCLINE HYCLATE 100 MG PO CAPS: 100.0000 mg | ORAL_CAPSULE | Freq: Two times a day (BID) | ORAL | 0 refills | Status: DC

## 2021-11-28 NOTE — Telephone Encounter (Signed)
Recommend spotted fever IgG G+ and IgM negative.  IgG may be persistent positive from previous infection which patient reports however he is symptomatic with fatigue, headaches and nausea/vomiting so we will treat for possible reinfection at this time.  Sent doxycycline x1 week. ?

## 2021-11-28 NOTE — Telephone Encounter (Signed)
Spoke to pt let him know that UC has sent in a prescription. Pt stated how unhappy he was with his visit to UC. Pt aware that a medication was sent in. ? ?KP ?

## 2021-11-28 NOTE — Telephone Encounter (Addendum)
? ? ? ?  Chief Complaint: Tick bite 1 month ago. Seen in [facility] 11/25/21. Positive for [facility] Spotted Fever. UC never sent antibiotic in after he talked to them yesterday. Labs in [place]. ?Symptoms: Headache, body aches, had vomiting ?Frequency: Seen in UC this week. ?Pertinent Negatives: Patient denies  ?Disposition: '[]'$ ED /'[]'$ Urgent Care (no appt availability in office) / '[]'$ Appointment(In office/virtual)/ '[]'$  [place] [facility]/ '[]'$ Home Care/ '[]'$ Refused Recommended Disposition /'[]'$ [place] [facility]/ '[x]'$  Follow-up with PCP ?Additional Notes: Asking if Dr. Ronnald Ramp will treat him and call in antibiotic.  ?Answer Assessment - Initial Assessment Questions ?1. TYPE of TICK: "Is it a wood tick or a deer tick?" (e.g., deer tick, wood tick; unsure) ?    Unsure ?2. SIZE of TICK: "How big is the tick?" (e.g., size of poppy seed, apple seed, watermelon seed; unsure) Note: Deer ticks can be the size of a poppy seed (nymph) or an apple seed (adult).   ?    Unsure ?3. ENGORGED: "Did the tick look flat or engorged (full, swollen)?" (e.g., flat, engorged; unsure) ?    No ?4. LOCATION: "Where is the tick bite located?"  ?    Back ?5. ONSET: "How long do you think the tick was attached before you removed it?" (e.g., 5 hours, 2 days)  ?    Unsure ?6. APPEARANCE of BITE or RASH: "What does the site look like?" ?    Slightly red ?7. PREGNANCY: "Is there any chance you are pregnant?" "When was your last menstrual period?" ?    N/a ? ?Protocols used: Tick Bite-A-AH ? ?

## 2021-12-01 ENCOUNTER — Telehealth: Payer: Self-pay | Admitting: Emergency Medicine

## 2021-12-01 NOTE — Telephone Encounter (Signed)
Was asked to enter doxycycline for recommended spotted fever diagnosis.  When open the chart I found that it had already been prescribed by another provider.  No further action on this chart. ?

## 2021-12-03 ENCOUNTER — Ambulatory Visit: Payer: Self-pay | Admitting: *Deleted

## 2021-12-03 ENCOUNTER — Ambulatory Visit (INDEPENDENT_AMBULATORY_CARE_PROVIDER_SITE_OTHER): Payer: 59 | Admitting: Family Medicine

## 2021-12-03 ENCOUNTER — Encounter: Payer: Self-pay | Admitting: Family Medicine

## 2021-12-03 ENCOUNTER — Telehealth: Payer: Self-pay

## 2021-12-03 VITALS — BP 138/88 | HR 86 | Temp 98.2°F | Ht 73.0 in | Wt 309.0 lb

## 2021-12-03 DIAGNOSIS — R509 Fever, unspecified: Secondary | ICD-10-CM | POA: Diagnosis not present

## 2021-12-03 DIAGNOSIS — R519 Headache, unspecified: Secondary | ICD-10-CM

## 2021-12-03 DIAGNOSIS — D229 Melanocytic nevi, unspecified: Secondary | ICD-10-CM

## 2021-12-03 DIAGNOSIS — Z20822 Contact with and (suspected) exposure to covid-19: Secondary | ICD-10-CM

## 2021-12-03 DIAGNOSIS — R768 Other specified abnormal immunological findings in serum: Secondary | ICD-10-CM

## 2021-12-03 DIAGNOSIS — A770 Spotted fever due to Rickettsia rickettsii: Secondary | ICD-10-CM

## 2021-12-03 LAB — POCT INFLUENZA A/B
Influenza A, POC: NEGATIVE
Influenza B, POC: NEGATIVE

## 2021-12-03 LAB — POC COVID19 BINAXNOW: SARS Coronavirus 2 Ag: NEGATIVE

## 2021-12-03 MED ORDER — DOXYCYCLINE HYCLATE 100 MG PO TABS: 100.0000 mg | ORAL_TABLET | Freq: Two times a day (BID) | ORAL | 0 refills | Status: DC

## 2021-12-03 NOTE — Telephone Encounter (Signed)
Pt returned call and was read the message that his Covid and flu test results are negative from [facility], CMA's note today at 10:39 AM. ? ?No questions from pt. ?

## 2021-12-03 NOTE — Progress Notes (Signed)
? ? ?Date:  12/03/2021  ? ?Name:  Craig Vargas.   DOB:  1971/07/12   MRN:  [id number] ? ? ?Chief Complaint: Headache (F/up urgent care- nausea, vomitting, chills, fever and headaches. On day 5 of doxycycline) ? ?Seen for suspected RMSF 8 days ago and not better. Howecer headache now comes and goes. ? ?Headache  ?This is a new problem. The current episode started in the past 7 days. The problem occurs intermittently. The problem has been gradually improving. The pain is located in the Frontal region. The pain does not radiate. The quality of the pain is described as band-like and sharp (none now). The pain is mild. Associated symptoms include a fever, muscle aches and photophobia. Pertinent negatives include no abdominal pain, coughing, ear pain, rhinorrhea, scalp tenderness, sinus pressure or sore throat.  ?Fever  ?This is a new problem. The current episode started in the past 7 days. The problem has been waxing and waning. His temperature was unmeasured prior to arrival. Associated symptoms include headaches and muscle aches. Pertinent negatives include no abdominal pain, chest pain, congestion, coughing, ear pain, rash, sore throat or wheezing. Treatments tried: doxycycline.  ? ?Lab Results  ?Component Value Date  ? NA 143 09/19/2021  ? K 4.4 09/19/2021  ? CO2 26 09/19/2021  ? GLUCOSE 116 (H) 09/19/2021  ? BUN 16 09/19/2021  ? CREATININE 0.99 09/19/2021  ? CALCIUM 9.9 09/19/2021  ? EGFR 92 09/19/2021  ? GFRNONAA >60 06/10/2021  ? ?Lab Results  ?Component Value Date  ? CHOL 235 (H) 09/19/2021  ? HDL 30 (L) 09/19/2021  ? LDLCALC 139 (H) 09/19/2021  ? TRIG 363 (H) 09/19/2021  ? CHOLHDL 6.0 06/10/2021  ? ?No results found for: TSH ?No results found for: HGBA1C ?Lab Results  ?Component Value Date  ? WBC 4.5 06/10/2021  ? HGB 16.6 06/10/2021  ? HCT 49.5 06/10/2021  ? MCV 88.6 06/10/2021  ? PLT 221 06/10/2021  ? ?Lab Results  ?Component Value Date  ? ALT 50 (H) 06/10/2021  ? AST 60 (H) 06/10/2021  ? ALKPHOS 73  06/10/2021  ? BILITOT 1.0 06/10/2021  ? ?No results found for: 25OHVITD2, [place], VD25OH  ? ?Review of Systems  ?Constitutional:  Positive for chills and fever.  ?HENT:  Negative for congestion, ear pain, rhinorrhea, sinus pressure and sore throat.   ?Eyes:  Positive for photophobia.  ?Respiratory:  Negative for cough, shortness of breath and wheezing.   ?Cardiovascular:  Negative for chest pain and leg swelling.  ?Gastrointestinal:  Negative for abdominal pain.  ?Musculoskeletal:  Positive for myalgias.  ?Skin:  Negative for rash.  ?Neurological:  Positive for headaches.  ?Hematological:  Does not bruise/bleed easily.  ? ?Patient Active Problem List  ? Diagnosis Date Noted  ? Allergic rhinitis, seasonal 09/24/2021  ? Insomnia 09/24/2021  ? Nephrolithiasis 09/24/2021  ? 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 Retinal detachment, left 05/22/2020  ? Screening for colorectal cancer 01/12/2020  ? OSA (obstructive sleep apnea) 06/23/2017  ? Anger reaction 05/03/2017  ? Anxiety 05/03/2017  ? Closed dislocation of carpometacarpal joint of wrist 01/15/2017  ? PTSD (post-traumatic stress disorder) 08/21/2016  ? ? ?Allergies  ?Allergen Reactions  ? Statins   ?  Leg aches/ pains  ? ? ?Past Surgical History:  ?Procedure Laterality Date  ? CYSTOSCOPY/URETEROSCOPY/HOLMIUM LASER/STENT PLACEMENT Right 12/19/2020  ? Procedure: CYSTOSCOPY/URETEROSCOPY/HOLMIUM LASER/STENT PLACEMENT;  Surgeon: Hollice Espy, MD;  Location: [facility] ORS;  Service: Urology;  Laterality: Right;  ? FRACTURE SURGERY  Right 02/2017  ? LITHOTRIPSY Left   ? ? ?Social History  ? ?Tobacco Use  ? Smoking status: Never  ?  Passive exposure: Never  ? Smokeless tobacco: Never  ?Vaping Use  ? Vaping Use: Never used  ?Substance Use Topics  ? Alcohol use: Yes  ?  Comment: occasional  ? Drug use: No  ? ? ? ?Medication list has been reviewed and updated. ? ?Current Meds  ?Medication Sig  ? busPIRone (BUSPAR) 5 MG tablet Take 1 tablet (5 mg total) by mouth  2 (two) times daily.  ? doxycycline (VIBRA-TABS) 100 MG tablet Take 1 tablet (100 mg total) by mouth 2 (two) times daily.  ? doxycycline (VIBRAMYCIN) 100 MG capsule Take 1 capsule (100 mg total) by mouth 2 (two) times daily for 7 days.  ? DULoxetine (CYMBALTA) 30 MG capsule Take 1 capsule (30 mg total) by mouth 2 (two) times daily.  ? losartan (COZAAR) 50 MG tablet Take 1 tablet (50 mg total) by mouth daily.  ? ? ? ?  09/19/2021  ?  2:39 PM  ?GAD 7 : Generalized Anxiety Score  ?Nervous, Anxious, on Edge 0  ?Control/stop worrying 0  ?Worry too much - different things 0  ?Trouble relaxing 0  ?Restless 0  ?Easily annoyed or irritable 0  ?Afraid - awful might happen 0  ?Total GAD 7 Score 0  ?Anxiety Difficulty Not difficult at all  ? ? ? ?  09/19/2021  ?  2:39 PM  ?Depression screen PHQ 2/9  ?Decreased Interest 0  ?Down, Depressed, Hopeless 0  ?PHQ - 2 Score 0  ?Altered sleeping 0  ?Tired, decreased energy 0  ?Change in appetite 0  ?Feeling bad or failure about yourself  0  ?Trouble concentrating 0  ?Moving slowly or fidgety/restless 0  ?Suicidal thoughts 0  ?PHQ-9 Score 0  ?Difficult doing work/chores Not difficult at all  ? ? ?BP Readings from Last 3 Encounters:  ?12/03/21 138/88  ?11/25/21 (!) 160/106  ?10/07/21 138/86  ? ? ?Physical Exam ?Vitals and nursing note reviewed.  ?HENT:  ?   Head: Normocephalic.  ?   Right Ear: Tympanic membrane and external ear normal.  ?   Left Ear: Tympanic membrane and external ear normal.  ?   Nose: Nose normal.  ?   Right Turbinates: Not swollen.  ?   Left Turbinates: Not swollen.  ?   Mouth/Throat:  ?   Lips: Pink.  ?   Pharynx: Oropharynx is clear. Uvula midline. No pharyngeal swelling, oropharyngeal exudate, posterior oropharyngeal erythema or uvula swelling.  ?   Tonsils: No tonsillar exudate.  ?Eyes:  ?   General: No scleral icterus.    ?   Right eye: No discharge.     ?   Left eye: No discharge.  ?   Conjunctiva/sclera: Conjunctivae normal.  ?   Pupils: Pupils are equal, round, and  reactive to light.  ?Neck:  ?   Thyroid: No thyromegaly.  ?   Vascular: No JVD.  ?   Trachea: No tracheal deviation.  ?Cardiovascular:  ?   Rate and Rhythm: Normal rate and regular rhythm.  ?   Heart sounds: Normal heart sounds. No murmur heard. ?  No friction rub. No gallop.  ?Pulmonary:  ?   Effort: No respiratory distress.  ?   Breath sounds: Normal breath sounds. No decreased breath sounds, wheezing, rhonchi or rales.  ?Abdominal:  ?   General: Bowel sounds are normal.  ?   Palpations:  Abdomen is soft. There is no mass.  ?   Tenderness: There is no abdominal tenderness. There is no guarding or rebound.  ?Musculoskeletal:     ?   General: No tenderness. Normal range of motion.  ?   Cervical back: Normal range of motion and neck supple.  ?Lymphadenopathy:  ?   Cervical: No cervical adenopathy.  ?Skin: ?   General: Skin is warm.  ?   Findings: No rash.  ?   Comments: No rash wrists and ankles.  ?Neurological:  ?   Mental Status: He is alert and oriented to person, place, and time.  ?   Deep Tendon Reflexes: Reflexes are normal and symmetric.  ? ? ?Wt Readings from Last 3 Encounters:  ?12/03/21 (!) 309 lb (140.2 kg)  ?11/25/21 300 lb (136.1 kg)  ?10/07/21 (!) 312 lb (141.5 kg)  ? ? ?BP 138/88 (BP Location: Left Arm, Cuff Size: Large)   Pulse 86   Temp 98.2 ?F (36.8 ?C) (Oral)   Ht _0  (1.854 m)   Wt (!) 309 lb (140.2 kg)   SpO2 98%   BMI 40.77 kg/m?  ? ?Assessment and Plan: ? ?1. Exposure to COVID-19 virus ?Patient seen previous in the urgent care last week at which time COVID was noted to be negative.  But since it has been learned that he has had exposure to [place] and his mother had most recently.  We will recheck COVID 19/this was negative in the office. ?- POC COVID-19 ? ?2. Fever and chills ?Persistent.  New onset.  Residual over about a week.  Patient is on day 5 of doxycycline 100 mg twice a day.  Review of note there is a suggestion to repeat the Lyme titer given that there may be a window that it  could have been negative on the reflex.  This is day 8 from the last check in in the 7 to 14-day window.  We will also check a CBC if there is leukocytosis and a platelet count is decreased we will check occa

## 2021-12-03 NOTE — Telephone Encounter (Signed)
Called pt to let him know his results for Covid and flu that was done in the office today. Covid and flu are negative. ? ?[place] nurse may give results to patient if they return call to clinic, a CRM has been created. ? ?KP ?

## 2021-12-03 NOTE — Telephone Encounter (Signed)
Pt returned call and was informed his Covid and flu tests were negative.   ?

## 2021-12-04 LAB — LYME DISEASE SEROLOGY W/REFLEX: Lyme Total Antibody EIA: POSITIVE

## 2021-12-04 LAB — CBC WITH DIFFERENTIAL/PLATELET
Basophils Absolute: 0.1 10*3/uL (ref 0.0–0.2)
Basos: 2 %
EOS (ABSOLUTE): 0.1 10*3/uL (ref 0.0–0.4)
Eos: 3 %
Hematocrit: 45.7 % (ref 37.5–51.0)
Hemoglobin: 16.3 g/dL (ref 13.0–17.7)
Immature Grans (Abs): 0 10*3/uL (ref 0.0–0.1)
Immature Granulocytes: 0 %
Lymphocytes Absolute: 1.3 10*3/uL (ref 0.7–3.1)
Lymphs: 25 %
MCH: 30 pg (ref 26.6–33.0)
MCHC: 35.7 g/dL (ref 31.5–35.7)
MCV: 84 fL (ref 79–97)
Monocytes Absolute: 0.4 10*3/uL (ref 0.1–0.9)
Monocytes: 7 %
Neutrophils Absolute: 3.3 10*3/uL (ref 1.4–7.0)
Neutrophils: 63 %
Platelets: 264 10*3/uL (ref 150–450)
RBC: 5.43 x10E6/uL (ref 4.14–5.80)
RDW: 12.2 % (ref 11.6–15.4)
WBC: 5.2 10*3/uL (ref 3.4–10.8)

## 2021-12-04 LAB — LYME IGG/IGM
Lyme IgG EIA: NEGATIVE
Lyme IgM EIA: NEGATIVE

## 2021-12-05 ENCOUNTER — Encounter: Payer: Self-pay | Admitting: Family Medicine

## 2021-12-05 ENCOUNTER — Ambulatory Visit (INDEPENDENT_AMBULATORY_CARE_PROVIDER_SITE_OTHER): Payer: 59 | Admitting: Family Medicine

## 2021-12-05 VITALS — BP 139/98 | HR 104 | Ht 73.0 in | Wt 309.0 lb

## 2021-12-05 DIAGNOSIS — I10 Essential (primary) hypertension: Secondary | ICD-10-CM | POA: Diagnosis not present

## 2021-12-05 DIAGNOSIS — R768 Other specified abnormal immunological findings in serum: Secondary | ICD-10-CM

## 2021-12-05 MED ORDER — DOXYCYCLINE HYCLATE 100 MG PO TABS: 100.0000 mg | ORAL_TABLET | Freq: Two times a day (BID) | ORAL | 0 refills | Status: DC

## 2021-12-05 MED ORDER — AMLODIPINE BESYLATE 5 MG PO TABS: 5.0000 mg | ORAL_TABLET | Freq: Every day | ORAL | 1 refills | Status: DC

## 2021-12-05 NOTE — Patient Instructions (Signed)

## 2021-12-05 NOTE — Progress Notes (Signed)
Date:  12/05/2021   Name:  Craig Vargas.   DOB:  [birth year]   MRN:  [id number]   Chief Complaint: Follow-up  HPI  Lab Results  Component Value Date   NA 143 09/19/2021   K 4.4 09/19/2021   CO2 26 09/19/2021   GLUCOSE 116 (H) 09/19/2021   BUN 16 09/19/2021   CREATININE 0.99 09/19/2021   CALCIUM 9.9 09/19/2021   EGFR 92 09/19/2021   GFRNONAA >60 06/10/2021   Lab Results  Component Value Date   CHOL 235 (H) 09/19/2021   HDL 30 (L) 09/19/2021   LDLCALC 139 (H) 09/19/2021   TRIG 363 (H) 09/19/2021   CHOLHDL 6.0 06/10/2021   No results found for: TSH No results found for: HGBA1C Lab Results  Component Value Date   WBC 5.2 12/03/2021   HGB 16.3 12/03/2021   HCT 45.7 12/03/2021   MCV 84 12/03/2021   PLT 264 12/03/2021   Lab Results  Component Value Date   ALT 50 (H) 06/10/2021   AST 60 (H) 06/10/2021   ALKPHOS 73 06/10/2021   BILITOT 1.0 06/10/2021   No results found for: 25OHVITD2, 25OHVITD3, VD25OH   Review of Systems  Constitutional:  Negative for chills and fever.  HENT:  Negative for drooling, ear discharge, ear pain and sore throat.   Respiratory:  Negative for cough, shortness of breath and wheezing.   Cardiovascular:  Negative for chest pain, palpitations and leg swelling.  Gastrointestinal:  Negative for abdominal pain, blood in stool, constipation, diarrhea and nausea.  Endocrine: Negative for polydipsia.  Genitourinary:  Negative for dysuria, frequency, hematuria and urgency.  Musculoskeletal:  Negative for back pain, myalgias and neck pain.  Skin:  Negative for rash.  Allergic/Immunologic: Negative for environmental allergies.  Neurological:  Negative for dizziness and headaches.  Hematological:  Does not bruise/bleed easily.  Psychiatric/Behavioral:  Negative for suicidal ideas. The patient is not nervous/anxious.    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   Retinal detachment, left 05/22/2020   Screening for colorectal cancer 01/12/2020   OSA (obstructive sleep apnea) 06/23/2017   Anger reaction 05/03/2017   Anxiety 05/03/2017   Closed dislocation of carpometacarpal joint of wrist 01/15/2017   PTSD (post-traumatic stress disorder) 08/21/2016    Allergies  Allergen Reactions   Statins     Leg aches/ pains    Past Surgical History:  Procedure Laterality Date   CYSTOSCOPY/URETEROSCOPY/HOLMIUM LASER/STENT PLACEMENT Right 12/19/2020   Procedure: CYSTOSCOPY/URETEROSCOPY/HOLMIUM LASER/STENT PLACEMENT;  Surgeon: Hollice Espy, MD;  Location: [facility];  Service: Urology;  Laterality: Right;   FRACTURE SURGERY Right 02/2017   LITHOTRIPSY Left     Social History   Tobacco Use   Smoking status: Never    Passive exposure: Never   Smokeless tobacco: Never  Vaping Use   Vaping Use: Never used  Substance Use Topics   Alcohol use: Yes    Comment: occasional   Drug use: No     Medication list has been reviewed and updated.  Current Meds  Medication Sig   busPIRone (BUSPAR) 5 MG tablet Take 1 tablet (5 mg total) by mouth 2 (two) times daily.   doxycycline (VIBRA-TABS) 100 MG tablet Take 1 tablet (100 mg total) by mouth 2 (two) times daily.   DULoxetine (CYMBALTA) 30 MG capsule Take 1 capsule (30 mg total) by  mouth 2 (two) times daily.   losartan (COZAAR) 50 MG tablet Take 1 tablet (50 mg total) by mouth daily.       09/19/2021    2:39 PM  GAD 7 : Generalized Anxiety Score  Nervous, Anxious, on Edge 0  Control/stop worrying 0  Worry too much - different things 0  Trouble relaxing 0  Restless 0  Easily annoyed or irritable 0  Afraid - awful might happen 0  Total GAD 7 Score 0  Anxiety Difficulty Not difficult at all       09/19/2021    2:39 PM  Depression screen PHQ 2/9  Decreased Interest 0  Down, Depressed, Hopeless 0  PHQ - 2 Score 0   Altered sleeping 0  Tired, decreased energy 0  Change in appetite 0  Feeling bad or failure about yourself  0  Trouble concentrating 0  Moving slowly or fidgety/restless 0  Suicidal thoughts 0  PHQ-9 Score 0  Difficult doing work/chores Not difficult at all    BP Readings from Last 3 Encounters:  12/05/21 (!) 134/110  12/03/21 138/88  11/25/21 (!) 160/106    Physical Exam Vitals and nursing note reviewed.  HENT:     Head: Normocephalic.     Right Ear: Tympanic membrane, ear canal and external ear normal.     Left Ear: Tympanic membrane, ear canal and external ear normal.     Nose: Nose normal. No congestion or rhinorrhea.     Mouth/Throat:     Mouth: Mucous membranes are moist.     Pharynx: No oropharyngeal exudate or posterior oropharyngeal erythema.  Eyes:     General: No scleral icterus.       Right eye: No discharge.        Left eye: No discharge.     Conjunctiva/sclera: Conjunctivae normal.     Pupils: Pupils are equal, round, and reactive to light.  Neck:     Thyroid: No thyromegaly.     Vascular: No JVD.     Trachea: No tracheal deviation.  Cardiovascular:     Rate and Rhythm: Normal rate and regular rhythm.     Heart sounds: Normal heart sounds. No murmur heard.   No friction rub. No gallop.  Pulmonary:     Effort: No respiratory distress.     Breath sounds: Normal breath sounds. No wheezing, rhonchi or rales.  Abdominal:     General: Bowel sounds are normal.     Palpations: Abdomen is soft. There is no mass.     Tenderness: There is no abdominal tenderness. There is no guarding or rebound.  Musculoskeletal:        General: No tenderness. Normal range of motion.     Cervical back: Normal range of motion and neck supple.  Lymphadenopathy:     Cervical: No cervical adenopathy.  Skin:    General: Skin is warm.     Findings: No rash.  Neurological:     Mental Status: He is alert and oriented to person, place, and time.     Cranial Nerves: No cranial  nerve deficit.     Deep Tendon Reflexes: Reflexes are normal and symmetric.    Wt Readings from Last 3 Encounters:  12/05/21 (!) 309 lb (140.2 kg)  12/03/21 (!) 309 lb (140.2 kg)  11/25/21 300 lb (136.1 kg)    BP (!) 134/110   Pulse (!) 104   Ht 6' 1"  (1.854 m)   Wt (!) 309 lb (140.2 kg)  BMI 40.77 kg/m   Assessment and Plan:  1. Positive Lyme disease serology Chronic (secondary) versus acute in the latter stages of Lyme disease.  Patient continues to have intermittent myalgias and general malaise headache is improved.  We have had positive IgG for Lyme that went from equivocal to positive but IgG and IgM's are normal.  So we are either looking at the latter stages of an acute illness versus the possibility of secondary Lyme.  If that is the case we need to go at least 21 days of treatment and we will extend the doxycycline for another 10 to 14 days the cover in this direction and will be rechecking in 6 weeks.  However if patient continues to have symptomatology we can consider the possibility of infectious disease taking a look and seeing if we need to carry out any further or look in a different direction. - doxycycline (VIBRA-TABS) 100 MG tablet; Take 1 tablet (100 mg total) by mouth 2 (two) times daily.  Dispense: 28 tablet; Refill: 0  2. Primary hypertension Chronic.  Uncontrolled.  Stable.  Blood pressure 139/98.  In addition to his losartan that we will keep at current dosing we will add amlodipine 5 mg become more more aggressive in lowering the blood pressure because patient is a little on the edge given his mother's medical condition and could probably need a little more help with bringing blood pressure down.  We will recheck it in 6 weeks on the combination of losartan and amlodipine and if patient is having any issues with either medications he is invited to call and we will go from proceed and other direction if necessary. - amLODipine (NORVASC) 5 MG tablet; Take 1 tablet (5  mg total) by mouth daily.  Dispense: 30 tablet; Refill: 1

## 2022-01-06 ENCOUNTER — Encounter: Payer: Self-pay | Admitting: Family Medicine

## 2022-01-06 ENCOUNTER — Ambulatory Visit (INDEPENDENT_AMBULATORY_CARE_PROVIDER_SITE_OTHER): Payer: 59 | Admitting: Family Medicine

## 2022-01-06 VITALS — BP 134/98 | HR 88 | Ht 73.0 in | Wt 295.0 lb

## 2022-01-06 DIAGNOSIS — F419 Anxiety disorder, unspecified: Secondary | ICD-10-CM | POA: Diagnosis not present

## 2022-01-06 DIAGNOSIS — F430 Acute stress reaction: Secondary | ICD-10-CM

## 2022-01-06 DIAGNOSIS — I10 Essential (primary) hypertension: Secondary | ICD-10-CM

## 2022-01-06 DIAGNOSIS — F410 Panic disorder [episodic paroxysmal anxiety] without agoraphobia: Secondary | ICD-10-CM | POA: Diagnosis not present

## 2022-01-06 MED ORDER — ALPRAZOLAM 0.25 MG PO TABS: 0.2500 mg | ORAL_TABLET | Freq: Two times a day (BID) | ORAL | 0 refills | Status: DC | PRN

## 2022-01-06 MED ORDER — AMLODIPINE BESYLATE 10 MG PO TABS: 10.0000 mg | ORAL_TABLET | Freq: Every day | ORAL | 1 refills | Status: DC

## 2022-01-06 NOTE — Progress Notes (Signed)
Date:  01/06/2022   Name:  Craig Vargas.   DOB:  Jul 15, 1971   MRN:  [id number]   Chief Complaint: Depression (19 and 18)  Depression        This is a chronic problem.  The current episode started more than 1 year ago.   The onset quality is gradual.   Associated symptoms include helplessness, hopelessness, insomnia, decreased interest and sad.  Associated symptoms include no decreased concentration, no fatigue, not irritable, no restlessness, no headaches and no suicidal ideas.  Past treatments include SNRIs - Serotonin and norepinephrine reuptake inhibitors.  Past medical history includes anxiety.   Anxiety Presents for follow-up visit. Symptoms include excessive worry, insomnia, nervous/anxious behavior and panic. Patient reports no chest pain, compulsions, decreased concentration, irritability, obsessions, palpitations, restlessness, shortness of breath or suicidal ideas.    Hypertension This is a chronic problem. The current episode started more than 1 year ago. The problem has been waxing and waning since onset. The problem is uncontrolled. Associated symptoms include anxiety. Pertinent negatives include no blurred vision, chest pain, headaches, malaise/fatigue, neck pain, orthopnea, palpitations, peripheral edema, PND, shortness of breath or sweats. There are no associated agents to hypertension. Risk factors for coronary artery disease include dyslipidemia. The current treatment provides moderate improvement. There are no compliance problems.     Lab Results  Component Value Date   NA 143 09/19/2021   K 4.4 09/19/2021   CO2 26 09/19/2021   GLUCOSE 116 (H) 09/19/2021   BUN 16 09/19/2021   CREATININE 0.99 09/19/2021   CALCIUM 9.9 09/19/2021   EGFR 92 09/19/2021   GFRNONAA >60 06/10/2021   Lab Results  Component Value Date   CHOL 235 (H) 09/19/2021   HDL 30 (L) 09/19/2021   LDLCALC 139 (H) 09/19/2021   TRIG 363 (H) 09/19/2021   CHOLHDL 6.0 06/10/2021   No results  found for: "TSH" No results found for: "HGBA1C" Lab Results  Component Value Date   WBC 5.2 12/03/2021   HGB 16.3 12/03/2021   HCT 45.7 12/03/2021   MCV 84 12/03/2021   PLT 264 12/03/2021   Lab Results  Component Value Date   ALT 50 (H) 06/10/2021   AST 60 (H) 06/10/2021   ALKPHOS 73 06/10/2021   BILITOT 1.0 06/10/2021   No results found for: "25OHVITD2", "25OHVITD3", "VD25OH"   Review of Systems  Constitutional:  Negative for fatigue, irritability and malaise/fatigue.  Eyes:  Negative for blurred vision.  Respiratory:  Negative for shortness of breath.   Cardiovascular:  Negative for chest pain, palpitations, orthopnea and PND.  Musculoskeletal:  Negative for neck pain.  Neurological:  Negative for headaches.  Psychiatric/Behavioral:  Positive for depression. Negative for decreased concentration and suicidal ideas. The patient is nervous/anxious and has insomnia.     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   Retinal detachment, left 05/22/2020   Screening for colorectal cancer 01/12/2020   OSA (obstructive sleep apnea) 06/23/2017   Anger reaction 05/03/2017   Anxiety 05/03/2017   Closed dislocation of carpometacarpal joint of wrist 01/15/2017   PTSD (post-traumatic stress disorder) 08/21/2016    Allergies  Allergen Reactions   Statins     Leg aches/ pains    Past Surgical History:  Procedure Laterality Date   CYSTOSCOPY/URETEROSCOPY/HOLMIUM LASER/STENT PLACEMENT Right 12/19/2020   Procedure: CYSTOSCOPY/URETEROSCOPY/HOLMIUM LASER/STENT PLACEMENT;  Surgeon:  Hollice Espy, MD;  Location: [facility];  Service: Urology;  Laterality: Right;   FRACTURE SURGERY Right 02/2017   LITHOTRIPSY Left     Social History   Tobacco Use   Smoking status: Never    Passive exposure: Never   Smokeless tobacco: Never   Vaping Use   Vaping Use: Never used  Substance Use Topics   Alcohol use: Yes    Comment: occasional   Drug use: No     Medication list has been reviewed and updated.  Current Meds  Medication Sig   amLODipine (NORVASC) 5 MG tablet Take 1 tablet (5 mg total) by mouth daily.   busPIRone (BUSPAR) 5 MG tablet Take 1 tablet (5 mg total) by mouth 2 (two) times daily.   DULoxetine (CYMBALTA) 30 MG capsule Take 1 capsule (30 mg total) by mouth 2 (two) times daily.   losartan (COZAAR) 50 MG tablet Take 1 tablet (50 mg total) by mouth daily.       01/06/2022    9:44 AM 09/19/2021    2:39 PM  GAD 7 : Generalized Anxiety Score  Nervous, Anxious, on Edge 3 0  Control/stop worrying 3 0  Worry too much - different things 3 0  Trouble relaxing 3 0  Restless 3 0  Easily annoyed or irritable 0 0  Afraid - awful might happen 3 0  Total GAD 7 Score 18 0  Anxiety Difficulty Not difficult at all Not difficult at all       01/06/2022    9:43 AM  Depression screen PHQ 2/9  Decreased Interest 3  Down, Depressed, Hopeless 3  PHQ - 2 Score 6  Altered sleeping 2  Tired, decreased energy 3  Change in appetite 3  Feeling bad or failure about yourself  1  Trouble concentrating 2  Moving slowly or fidgety/restless 3  Suicidal thoughts 0  PHQ-9 Score 20  Difficult doing work/chores Somewhat difficult    BP Readings from Last 3 Encounters:  01/06/22 (!) 134/98  12/05/21 (!) 139/98  12/03/21 138/88    Physical Exam Vitals and nursing note reviewed.  Constitutional:      General: He is not irritable. Cardiovascular:     Heart sounds: S1 normal and S2 normal. No murmur heard.    No systolic murmur is present.     No diastolic murmur is present.     No friction rub. No gallop. No S3 or S4 sounds.  Pulmonary:     Effort: Pulmonary effort is normal.     Breath sounds: No wheezing or rhonchi.     Wt Readings from Last 3 Encounters:  01/06/22 295 lb (133.8 kg)  12/05/21 (!) 309 lb  (140.2 kg)  12/03/21 (!) 309 lb (140.2 kg)    BP (!) 134/98 (BP Location: Right Arm, Cuff Size: Large)   Pulse 88   Ht $R'6\' 1"'Ua$  (1.854 m)   Wt 295 lb (133.8 kg)   BMI 38.92 kg/m   Assessment and Plan:  1. Anxiety New onset.  Persistent.  Stable.  Patient currently on duloxetine but on no medications for long-term anxiety.  PHQ was 20 Gad score is 18.  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  Patient has recognized that there is stress and he is handling it relatively well with no intentional thoughts of self-harm. - ALPRAZolam (XANAX) 0.25 MG tablet; Take 1 tablet (0.25 mg total)  by mouth 2 (two) times daily as needed for anxiety.  Dispense: 20 tablet; Refill: 0  2. Primary hypertension Chronic.  Uncontrolled.  Stable.  Patient has had elevation of the blood pressure.  Will increase to 10 mg on the Norvasc given the stress level and will recheck in 2 weeks. - amLODipine (NORVASC) 10 MG tablet; Take 1 tablet (10 mg total) by mouth daily.  Dispense: 30 tablet; Refill: 1  3. Panic attack due to exceptional stress New onset.  Multiple episodes that he has had panic attacks that we have initiated alprazolam 0.25 to be taken on a as needed basis. - ALPRAZolam (XANAX) 0.25 MG tablet; Take 1 tablet (0.25 mg total) by mouth 2 (two) times daily as needed for anxiety.  Dispense: 20 tablet; Refill: 0

## 2022-01-16 ENCOUNTER — Ambulatory Visit: Payer: 59 | Admitting: Family Medicine

## 2022-01-26 ENCOUNTER — Ambulatory Visit (INDEPENDENT_AMBULATORY_CARE_PROVIDER_SITE_OTHER): Payer: 59 | Admitting: Family Medicine

## 2022-01-26 ENCOUNTER — Encounter: Payer: Self-pay | Admitting: Family Medicine

## 2022-01-26 VITALS — BP 138/80 | HR 72 | Ht 73.0 in | Wt 295.0 lb

## 2022-01-26 DIAGNOSIS — I10 Essential (primary) hypertension: Secondary | ICD-10-CM

## 2022-01-26 MED ORDER — AMLODIPINE BESYLATE 10 MG PO TABS: 10.0000 mg | ORAL_TABLET | Freq: Every day | ORAL | 1 refills | Status: DC

## 2022-01-26 MED ORDER — LOSARTAN POTASSIUM 50 MG PO TABS: 50.0000 mg | ORAL_TABLET | Freq: Every day | ORAL | 1 refills | Status: DC

## 2022-01-26 NOTE — Progress Notes (Signed)
Date:  01/26/2022   Name:  Craig Vargas.   DOB:  Dec 20, 1970   MRN:  [id number]   Chief Complaint: Hypertension (Bp recheck)  Hypertension This is a chronic problem. The current episode started more than 1 year ago. The problem has been gradually improving since onset. The problem is controlled. Pertinent negatives include no blurred vision, chest pain, headaches, neck pain, orthopnea, palpitations, PND or shortness of breath. There are no associated agents to hypertension. Risk factors for coronary artery disease include dyslipidemia. Past treatments include calcium channel blockers and angiotensin blockers. The current treatment provides moderate improvement. There are no compliance problems.     Lab Results  Component Value Date   NA 143 09/19/2021   K 4.4 09/19/2021   CO2 26 09/19/2021   GLUCOSE 116 (H) 09/19/2021   BUN 16 09/19/2021   CREATININE 0.99 09/19/2021   CALCIUM 9.9 09/19/2021   EGFR 92 09/19/2021   GFRNONAA >60 06/10/2021   Lab Results  Component Value Date   CHOL 235 (H) 09/19/2021   HDL 30 (L) 09/19/2021   LDLCALC 139 (H) 09/19/2021   TRIG 363 (H) 09/19/2021   CHOLHDL 6.0 06/10/2021   No results found for: "TSH" No results found for: "HGBA1C" Lab Results  Component Value Date   WBC 5.2 12/03/2021   HGB 16.3 12/03/2021   HCT 45.7 12/03/2021   MCV 84 12/03/2021   PLT 264 12/03/2021   Lab Results  Component Value Date   ALT 50 (H) 06/10/2021   AST 60 (H) 06/10/2021   ALKPHOS 73 06/10/2021   BILITOT 1.0 06/10/2021   No results found for: "25OHVITD2", "25OHVITD3", "VD25OH"   Review of Systems  Constitutional:  Negative for chills and fever.  HENT:  Negative for drooling, ear discharge, ear pain and sore throat.   Eyes:  Negative for blurred vision.  Respiratory:  Negative for cough, shortness of breath and wheezing.   Cardiovascular:  Negative for chest pain, palpitations, orthopnea, leg swelling and PND.  Gastrointestinal:  Negative for  abdominal pain, blood in stool, constipation, diarrhea and nausea.  Endocrine: Negative for polydipsia.  Genitourinary:  Negative for dysuria, frequency, hematuria and urgency.  Musculoskeletal:  Negative for back pain, myalgias and neck pain.  Skin:  Negative for rash.  Allergic/Immunologic: Negative for environmental allergies.  Neurological:  Negative for dizziness and headaches.  Hematological:  Does not bruise/bleed easily.  Psychiatric/Behavioral:  Negative for suicidal ideas. The patient is not nervous/anxious.     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   Retinal detachment, left 05/22/2020   Screening for colorectal cancer 01/12/2020   OSA (obstructive sleep apnea) 06/23/2017   Anger reaction 05/03/2017   Anxiety 05/03/2017   Closed dislocation of carpometacarpal joint of wrist 01/15/2017   PTSD (post-traumatic stress disorder) 08/21/2016    Allergies  Allergen Reactions   Statins     Leg aches/ pains    Past Surgical History:  Procedure Laterality Date   CYSTOSCOPY/URETEROSCOPY/HOLMIUM LASER/STENT PLACEMENT Right 12/19/2020   Procedure: CYSTOSCOPY/URETEROSCOPY/HOLMIUM LASER/STENT PLACEMENT;  Surgeon: Hollice Espy, MD;  Location: [facility];  Service: Urology;  Laterality: Right;   FRACTURE SURGERY Right 02/2017   LITHOTRIPSY Left     Social History   Tobacco Use   Smoking status: Never    Passive exposure: Never   Smokeless tobacco: Never  Vaping Use  Vaping Use: Never used  Substance Use Topics   Alcohol use: Yes    Comment: occasional   Drug use: No     Medication list has been reviewed and updated.  Current Meds  Medication Sig   ALPRAZolam (XANAX) 0.25 MG tablet Take 1 tablet (0.25 mg total) by mouth 2 (two) times daily as needed for anxiety.   amLODipine (NORVASC) 10 MG tablet Take 1  tablet (10 mg total) by mouth daily.   busPIRone (BUSPAR) 5 MG tablet Take 1 tablet (5 mg total) by mouth 2 (two) times daily.   DULoxetine (CYMBALTA) 30 MG capsule Take 1 capsule (30 mg total) by mouth 2 (two) times daily.   losartan (COZAAR) 50 MG tablet Take 1 tablet (50 mg total) by mouth daily.       01/26/2022    2:02 PM 01/06/2022    9:44 AM 09/19/2021    2:39 PM  GAD 7 : Generalized Anxiety Score  Nervous, Anxious, on Edge 2 3 0  Control/stop worrying 2 3 0  Worry too much - different things 2 3 0  Trouble relaxing 2 3 0  Restless 2 3 0  Easily annoyed or irritable 0 0 0  Afraid - awful might happen 1 3 0  Total GAD 7 Score 11 18 0  Anxiety Difficulty Somewhat difficult Not difficult at all Not difficult at all       01/26/2022    2:01 PM 01/06/2022    9:43 AM 09/19/2021    2:39 PM  Depression screen PHQ 2/9  Decreased Interest 1 3 0  Down, Depressed, Hopeless 1 3 0  PHQ - 2 Score 2 6 0  Altered sleeping 0 2 0  Tired, decreased energy 1 3 0  Change in appetite 1 3 0  Feeling bad or failure about yourself  0 1 0  Trouble concentrating 1 2 0  Moving slowly or fidgety/restless 0 3 0  Suicidal thoughts 0 0 0  PHQ-9 Score 5 20 0  Difficult doing work/chores Not difficult at all Somewhat difficult Not difficult at all    BP Readings from Last 3 Encounters:  01/26/22 138/80  01/06/22 (!) 134/98  12/05/21 (!) 139/98    Physical Exam Vitals and nursing note reviewed.  HENT:     Head: Normocephalic.     Right Ear: Tympanic membrane and external ear normal.     Left Ear: Tympanic membrane and external ear normal.     Nose: Nose normal.     Mouth/Throat:     Mouth: Mucous membranes are moist.  Eyes:     General: No scleral icterus.       Right eye: No discharge.        Left eye: No discharge.     Conjunctiva/sclera: Conjunctivae normal.     Pupils: Pupils are equal, round, and reactive to light.  Neck:     Thyroid: No thyromegaly.     Vascular: No JVD.      Trachea: No tracheal deviation.  Cardiovascular:     Rate and Rhythm: Normal rate and regular rhythm.     Heart sounds: Normal heart sounds. No murmur heard.    No friction rub. No gallop.  Pulmonary:     Effort: No respiratory distress.     Breath sounds: Normal breath sounds. No wheezing, rhonchi or rales.  Abdominal:     General: Bowel sounds are normal.     Palpations: Abdomen is soft. There is no mass.  Tenderness: There is no abdominal tenderness. There is no guarding or rebound.  Musculoskeletal:        General: No tenderness. Normal range of motion.     Cervical back: Normal range of motion and neck supple.  Lymphadenopathy:     Cervical: No cervical adenopathy.  Skin:    General: Skin is warm.     Findings: No rash.     Wt Readings from Last 3 Encounters:  01/26/22 295 lb (133.8 kg)  01/06/22 295 lb (133.8 kg)  12/05/21 (!) 309 lb (140.2 kg)    BP 138/80   Pulse 72   Ht _0  (1.854 m)   Wt 295 lb (133.8 kg)   BMI 38.92 kg/m   Assessment and Plan:  1. Primary hypertension Chronic.  Controlled.  Stable.  Asymptomatic.  Blood pressure today 138/80.  We will continue losartan 50 mg once a day and amlodipine 10 mg once a day.  We will recheck in 6 months at which time we will do renal function panel for evaluation of electrolytes and GFR. - losartan (COZAAR) 50 MG tablet; Take 1 tablet (50 mg total) by mouth daily.  Dispense: 90 tablet; Refill: 1 - amLODipine (NORVASC) 10 MG tablet; Take 1 tablet (10 mg total) by mouth daily.  Dispense: 30 tablet; Refill: 1

## 2022-03-16 ENCOUNTER — Other Ambulatory Visit: Payer: Self-pay | Admitting: Family Medicine

## 2022-03-16 DIAGNOSIS — F32A Depression, unspecified: Secondary | ICD-10-CM

## 2022-03-16 DIAGNOSIS — F419 Anxiety disorder, unspecified: Secondary | ICD-10-CM

## 2022-03-17 NOTE — Telephone Encounter (Signed)
Requested Prescriptions  Pending Prescriptions Disp Refills  . busPIRone (BUSPAR) 5 MG tablet [Pharmacy Med Name: BUSPIRONE 5MG TABLETS] 180 tablet 1    Sig: TAKE 1 TABLET(5 MG) BY MOUTH TWICE DAILY     Psychiatry: Anxiolytics/Hypnotics - Non-controlled Passed - 03/16/2022  5:07 PM      Passed - Valid encounter within last 12 months    Recent Outpatient Visits          1 month ago Primary hypertension   [place] [facility] [place], Deanna C, MD   2 months ago [place] [facility] at [place], Deanna C, MD   3 months ago Positive Lyme disease serology   [place] [facility] at [place], Deanna C, MD   3 months ago Exposure to COVID-19 virus   [facility] at [place], Deanna C, MD   5 months ago Primary hypertension   [place] [facility] at [place], [place], MD      Future Appointments            In 3 weeks Juline Patch, MD [facility] at [facility], [facility]           . DULoxetine (CYMBALTA) 30 MG capsule [Pharmacy Med Name: DULOXETINE DR 30MG CAPSULES] 180 capsule 1    Sig: TAKE 1 CAPSULE BY MOUTH TWICE DAILY     Psychiatry: Antidepressants - SNRI - duloxetine Passed - 03/16/2022  5:07 PM      Passed - Cr in normal range and within 360 days    Creatinine, Ser  Date Value Ref Range Status  09/19/2021 0.99 0.76 - 1.27 mg/dL Final         Passed - eGFR is 30 or above and within 360 days    GFR, Estimated  Date Value Ref Range Status  06/10/2021 >60 >60 mL/min Final    Comment:    (NOTE) Calculated using the CKD-EPI Creatinine Equation (2021)    eGFR  Date Value Ref Range Status  09/19/2021 92 >59 mL/min/1.73 Final         Passed - Completed PHQ-2 or PHQ-9 in the last 360 days      Passed - Last BP in normal range     BP Readings from Last 1 Encounters:  01/26/22 138/80         Passed - Valid encounter within last 6 months    Recent Outpatient Visits          1 month ago Primary hypertension   [place] [facility] at [place], Deanna C, MD   2 months ago [place] [facility] at [place], Deanna C, MD   3 months ago Positive Lyme disease serology   [place] [facility] at [place], Deanna C, MD   3 months ago Exposure to COVID-19 virus   [facility] at [place], Deanna C, MD   5 months ago Primary hypertension   [place] [facility] at [place], Deanna C, MD      Future Appointments            In 3 weeks Juline Patch, MD [facility] at [facility], [facility]

## 2022-03-22 ENCOUNTER — Other Ambulatory Visit: Payer: Self-pay | Admitting: Family Medicine

## 2022-03-22 DIAGNOSIS — I10 Essential (primary) hypertension: Secondary | ICD-10-CM

## 2022-03-25 NOTE — Telephone Encounter (Signed)
Requested Prescriptions  Pending Prescriptions Disp Refills  . amLODipine (NORVASC) 10 MG tablet [Pharmacy Med Name: AMLODIPINE BESYLATE '10MG'$  TABLETS] 90 tablet 1    Sig: TAKE 1 TABLET(10 MG) BY MOUTH DAILY     Cardiovascular: Calcium Channel Blockers 2 Passed - 03/22/2022 10:29 PM      Passed - Last BP in normal range    BP Readings from Last 1 Encounters:  01/26/22 138/80         Passed - Last Heart Rate in normal range    Pulse Readings from Last 1 Encounters:  01/26/22 72         Passed - Valid encounter within last 6 months    Recent Outpatient Visits          1 month ago Primary hypertension   [place] [facility] at [place], Deanna C, MD   2 months ago [place] [facility] at [place], Deanna C, MD   3 months ago Positive Lyme disease serology   [place] [facility] at [place], Deanna C, MD   3 months ago Exposure to COVID-19 virus   [facility] at [place], [place], MD   5 months ago Primary hypertension   [place] [facility] at [place], [place], MD      Future Appointments            In 2 weeks Juline Patch, MD [place] [facility] at [facility], [facility]

## 2022-04-09 ENCOUNTER — Encounter: Payer: Self-pay | Admitting: Family Medicine

## 2022-04-09 ENCOUNTER — Ambulatory Visit (INDEPENDENT_AMBULATORY_CARE_PROVIDER_SITE_OTHER): Payer: 59 | Admitting: Family Medicine

## 2022-04-09 VITALS — BP 138/80 | HR 82 | Ht 73.0 in | Wt 300.0 lb

## 2022-04-09 DIAGNOSIS — F419 Anxiety disorder, unspecified: Secondary | ICD-10-CM | POA: Diagnosis not present

## 2022-04-09 DIAGNOSIS — I10 Essential (primary) hypertension: Secondary | ICD-10-CM | POA: Diagnosis not present

## 2022-04-09 DIAGNOSIS — Z23 Encounter for immunization: Secondary | ICD-10-CM | POA: Diagnosis not present

## 2022-04-09 DIAGNOSIS — F32A Depression, unspecified: Secondary | ICD-10-CM

## 2022-04-09 MED ORDER — BUSPIRONE HCL 5 MG PO TABS: ORAL_TABLET | ORAL | 1 refills | Status: DC

## 2022-04-09 MED ORDER — LOSARTAN POTASSIUM 50 MG PO TABS: 50.0000 mg | ORAL_TABLET | Freq: Every day | ORAL | 1 refills | Status: DC

## 2022-04-09 MED ORDER — DULOXETINE HCL 30 MG PO CPEP: 30.0000 mg | ORAL_CAPSULE | Freq: Two times a day (BID) | ORAL | 1 refills | Status: DC

## 2022-04-09 MED ORDER — AMLODIPINE BESYLATE 10 MG PO TABS: ORAL_TABLET | ORAL | 1 refills | Status: DC

## 2022-04-09 NOTE — Progress Notes (Signed)
Date:  04/09/2022   Name:  Craig Vargas.   DOB:  23-Jan-1971   MRN:  [id number]   Chief Complaint: Hypertension, Depression, and Flu Vaccine  Hypertension This is a chronic problem. The current episode started more than 1 year ago. The problem has been gradually improving since onset. The problem is controlled. Pertinent negatives include no blurred vision, chest pain, headaches, orthopnea, palpitations, peripheral edema, PND or shortness of breath. Past treatments include angiotensin blockers and calcium channel blockers. The current treatment provides moderate improvement. There are no compliance problems.  There is no history of angina, kidney disease, CAD/MI, CVA, heart failure, left ventricular hypertrophy, PVD or retinopathy. There is no history of chronic renal disease, a hypertension causing med or renovascular disease.  Depression        This is a chronic problem.  The onset quality is undetermined.   The problem occurs intermittently.  The problem has been gradually improving since onset.  Associated symptoms include no decreased concentration, no helplessness, no hopelessness, no restlessness and no headaches.   Lab Results  Component Value Date   NA 143 09/19/2021   K 4.4 09/19/2021   CO2 26 09/19/2021   GLUCOSE 116 (H) 09/19/2021   BUN 16 09/19/2021   CREATININE 0.99 09/19/2021   CALCIUM 9.9 09/19/2021   EGFR 92 09/19/2021   GFRNONAA >60 06/10/2021   Lab Results  Component Value Date   CHOL 235 (H) 09/19/2021   HDL 30 (L) 09/19/2021   LDLCALC 139 (H) 09/19/2021   TRIG 363 (H) 09/19/2021   CHOLHDL 6.0 06/10/2021   No results found for: "TSH" No results found for: "HGBA1C" Lab Results  Component Value Date   WBC 5.2 12/03/2021   HGB 16.3 12/03/2021   HCT 45.7 12/03/2021   MCV 84 12/03/2021   PLT 264 12/03/2021   Lab Results  Component Value Date   ALT 50 (H) 06/10/2021   AST 60 (H) 06/10/2021   ALKPHOS 73 06/10/2021   BILITOT 1.0 06/10/2021    No results found for: "25OHVITD2", "25OHVITD3", "VD25OH"   Review of Systems  Eyes:  Negative for blurred vision.  Respiratory:  Negative for shortness of breath.   Cardiovascular:  Negative for chest pain, palpitations, orthopnea and PND.  Neurological:  Negative for headaches.  Psychiatric/Behavioral:  Positive for depression. Negative for decreased concentration.     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   Retinal detachment, left 05/22/2020   Screening for colorectal cancer 01/12/2020   OSA (obstructive sleep apnea) 06/23/2017   Anger reaction 05/03/2017   Anxiety 05/03/2017   Closed dislocation of carpometacarpal joint of wrist 01/15/2017   PTSD (post-traumatic stress disorder) 08/21/2016    Allergies  Allergen Reactions   Statins     Leg aches/ pains    Past Surgical History:  Procedure Laterality Date   CYSTOSCOPY/URETEROSCOPY/HOLMIUM LASER/STENT PLACEMENT Right 12/19/2020   Procedure: CYSTOSCOPY/URETEROSCOPY/HOLMIUM LASER/STENT PLACEMENT;  Surgeon: Hollice Espy, MD;  Location: [facility] ORS;  Service: Urology;  Laterality: Right;   FRACTURE SURGERY Right 02/2017   LITHOTRIPSY Left     Social History   Tobacco Use   Smoking status: Never    Passive exposure: Never   Smokeless tobacco: Never  Vaping Use   Vaping Use: Never used  Substance Use Topics   Alcohol use: Yes    Comment: occasional  Drug use: No     Medication list has been reviewed and updated.  Current Meds  Medication Sig   amLODipine (NORVASC) 10 MG tablet TAKE 1 TABLET(10 MG) BY MOUTH DAILY   busPIRone (BUSPAR) 5 MG tablet TAKE 1 TABLET(5 MG) BY MOUTH TWICE DAILY   DULoxetine (CYMBALTA) 30 MG capsule TAKE 1 CAPSULE BY MOUTH TWICE DAILY   losartan (COZAAR) 50 MG tablet Take 1 tablet (50 mg total) by mouth daily.    [DISCONTINUED] ALPRAZolam (XANAX) 0.25 MG tablet Take 1 tablet (0.25 mg total) by mouth 2 (two) times daily as needed for anxiety.       04/09/2022   10:59 AM 01/26/2022    2:02 PM 01/06/2022    9:44 AM 09/19/2021    2:39 PM  GAD 7 : Generalized Anxiety Score  Nervous, Anxious, on Edge 0 2 3 0  Control/stop worrying 0 2 3 0  Worry too much - different things 0 2 3 0  Trouble relaxing 0 2 3 0  Restless 0 2 3 0  Easily annoyed or irritable 0 0 0 0  Afraid - awful might happen 0 1 3 0  Total GAD 7 Score 0 11 18 0  Anxiety Difficulty Not difficult at all Somewhat difficult Not difficult at all Not difficult at all       04/09/2022   10:59 AM 01/26/2022    2:01 PM 01/06/2022    9:43 AM  Depression screen PHQ 2/9  Decreased Interest 0 1 3  Down, Depressed, Hopeless 0 1 3  PHQ - 2 Score 0 2 6  Altered sleeping 0 0 2  Tired, decreased energy 0 1 3  Change in appetite 0 1 3  Feeling bad or failure about yourself  0 0 1  Trouble concentrating 0 1 2  Moving slowly or fidgety/restless 0 0 3  Suicidal thoughts 0 0 0  PHQ-9 Score 0 5 20  Difficult doing work/chores Not difficult at all Not difficult at all Somewhat difficult    BP Readings from Last 3 Encounters:  04/09/22 138/80  01/26/22 138/80  01/06/22 (!) 134/98    Physical Exam Vitals and nursing note reviewed.  HENT:     Head: Normocephalic.     Right Ear: Tympanic membrane and external ear normal.     Left Ear: Tympanic membrane and external ear normal.     Nose: Nose normal.     Mouth/Throat:     Mouth: Mucous membranes are moist.  Eyes:     General: No scleral icterus.       Right eye: No discharge.        Left eye: No discharge.     Conjunctiva/sclera: Conjunctivae normal.     Pupils: Pupils are equal, round, and reactive to light.  Neck:     Thyroid: No thyromegaly.     Vascular: No JVD.     Trachea: No tracheal deviation.  Cardiovascular:     Rate and Rhythm: Normal rate and regular rhythm.     Heart sounds:  Normal heart sounds. No murmur heard.    No friction rub. No gallop.  Pulmonary:     Effort: No respiratory distress.     Breath sounds: Normal breath sounds. No wheezing, rhonchi or rales.  Abdominal:     General: Bowel sounds are normal.     Palpations: Abdomen is soft.     Tenderness: There is no abdominal tenderness.  Musculoskeletal:        General:  No tenderness.     Cervical back: Neck supple.  Lymphadenopathy:     Cervical: No cervical adenopathy.  Skin:    General: Skin is warm.     Findings: No rash.  Neurological:     Mental Status: He is alert.     Wt Readings from Last 3 Encounters:  04/09/22 300 lb (136.1 kg)  01/26/22 295 lb (133.8 kg)  01/06/22 295 lb (133.8 kg)    BP 138/80   Pulse 82   Ht 6' 1"  (1.854 m)   Wt 300 lb (136.1 kg)   BMI 39.58 kg/m   Assessment and Plan:  1. Primary hypertension Chronic.  Controlled.  Stable.  Blood pressure today is 138/80.  Continue amlodipine 10 mg and losartan 50 mg daily.  We will recheck in 6 months.  Review of previous electrolytes and GFR are acceptable. - amLODipine (NORVASC) 10 MG tablet; TAKE 1 TABLET(10 MG) BY MOUTH DAILY  Dispense: 90 tablet; Refill: 1 - losartan (COZAAR) 50 MG tablet; Take 1 tablet (50 mg total) by mouth daily.  Dispense: 90 tablet; Refill: 1  2. Anxiety Chronic.  Controlled.  Stable.  GAD score is 0.  Continue buspirone 5 mg twice a day.  We will recheck in 6 months. - busPIRone (BUSPAR) 5 MG tablet; TAKE 1 TABLET(5 MG) BY MOUTH TWICE DAILY  Dispense: 180 tablet; Refill: 1  3. Depression, unspecified depression type .  Controlled.  Stable.  PHQ is 0 continue duloxetine 30 mg twice a day. - DULoxetine (CYMBALTA) 30 MG capsule; Take 1 capsule (30 mg total) by mouth 2 (two) times daily.  Dispense: 180 capsule; Refill: 1  4. Need for immunization against influenza Discussed and administered - Flu Vaccine QUAD 52moIM (Fluarix, Fluzone & Alfiuria Quad PF)    DOtilio Miu MD

## 2022-04-23 ENCOUNTER — Other Ambulatory Visit: Payer: Self-pay | Admitting: Family Medicine

## 2022-04-23 DIAGNOSIS — I10 Essential (primary) hypertension: Secondary | ICD-10-CM

## 2022-04-23 NOTE — Telephone Encounter (Signed)
Unable to refill per protocol, last refill by provider 04/09/22 for 90 and 1. Will refuse duplicate request.  Requested Prescriptions  Pending Prescriptions Disp Refills  . amLODipine (NORVASC) 10 MG tablet [Pharmacy Med Name: AMLODIPINE BESYLATE '10MG'$  TABLETS] 30 tablet     Sig: TAKE 1 TABLET(10 MG) BY MOUTH DAILY     Cardiovascular: Calcium Channel Blockers 2 Passed - 04/23/2022  3:39 AM      Passed - Last BP in normal range    BP Readings from Last 1 Encounters:  04/09/22 138/80         Passed - Last Heart Rate in normal range    Pulse Readings from Last 1 Encounters:  04/09/22 82         Passed - Valid encounter within last 6 months    Recent Outpatient Visits          2 weeks ago Primary hypertension   [place] [facility] at [place], Deanna C, MD   2 months ago Primary hypertension    [facility] at [place], Deanna C, MD   3 months ago [place] [facility] at [place], Deanna C, MD   4 months ago Positive Lyme disease serology    Primary Care and Sports Medicine at [place], [place], MD   4 months ago Exposure to COVID-19 virus   [facility] at [facility], MD      Future Appointments            In 5 months Juline Patch, MD [facility] Care and Sports Medicine at [facility], [facility]

## 2022-05-09 ENCOUNTER — Other Ambulatory Visit: Payer: Self-pay | Admitting: Family Medicine

## 2022-05-09 DIAGNOSIS — F419 Anxiety disorder, unspecified: Secondary | ICD-10-CM

## 2022-05-09 DIAGNOSIS — I10 Essential (primary) hypertension: Secondary | ICD-10-CM

## 2022-05-09 DIAGNOSIS — F32A Depression, unspecified: Secondary | ICD-10-CM

## 2022-05-12 MED ORDER — AMLODIPINE BESYLATE 10 MG PO TABS: ORAL_TABLET | ORAL | 1 refills | Status: AC

## 2022-05-12 MED ORDER — DULOXETINE HCL 30 MG PO CPEP: 30.0000 mg | ORAL_CAPSULE | Freq: Two times a day (BID) | ORAL | 1 refills | Status: AC

## 2022-05-12 MED ORDER — BUSPIRONE HCL 5 MG PO TABS: ORAL_TABLET | ORAL | 1 refills | Status: AC

## 2022-06-29 IMAGING — CT CT RENAL STONE PROTOCOL
2 of 4 series · 15 of 46 positions shown, 17 images · non-contrast
Comparison: 05/18/2012

CLINICAL DATA: Right-sided low back pain, right flank pain,
hematuria, emesis

EXAM:
CT ABDOMEN AND PELVIS WITHOUT CONTRAST
TECHNIQUE: Multidetector CT imaging of the abdomen and pelvis was performed
following the standard protocol without IV contrast.

[Series 2: stone full standard · axial · 0.95mm/px · z∈[-1126,-576]mm · 12 of 121 slices shown, 14 images]
[im 6/121  soft-tissue]
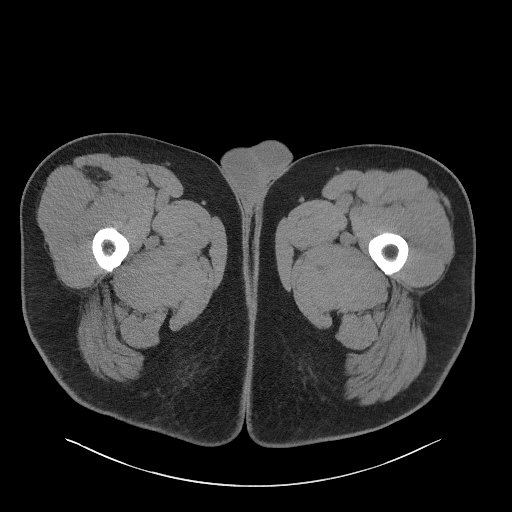
[im 6/121  bone]
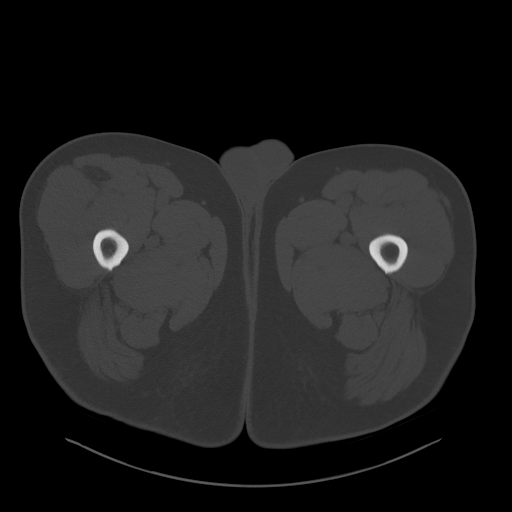
[im 16/121  soft-tissue]
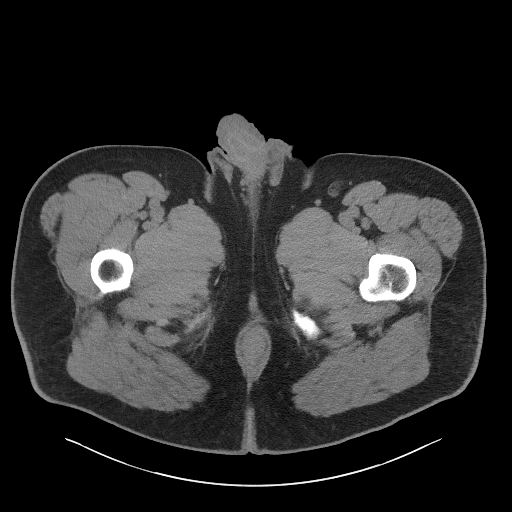
[im 26/121  soft-tissue]
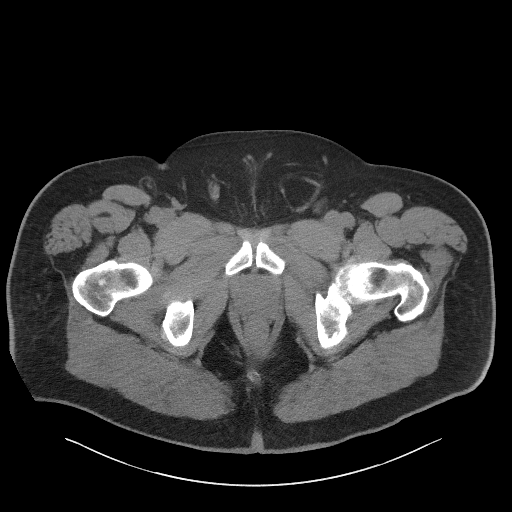
[im 36/121  soft-tissue]
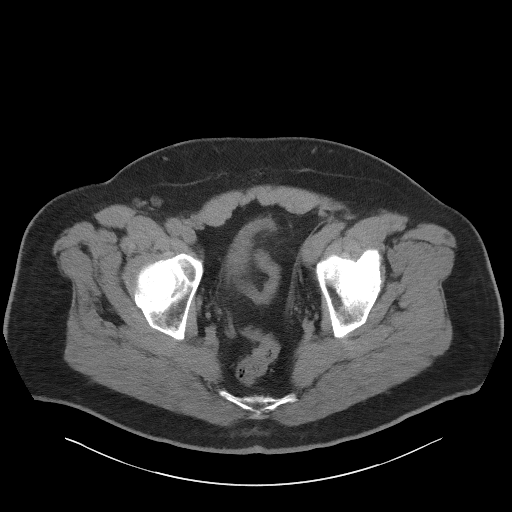
[im 46/121  soft-tissue]
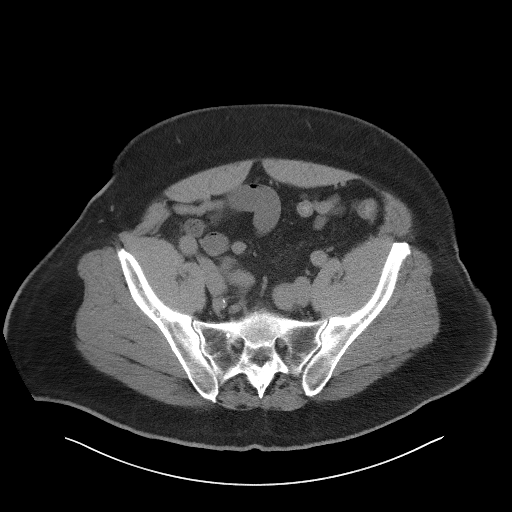
[im 56/121  soft-tissue]
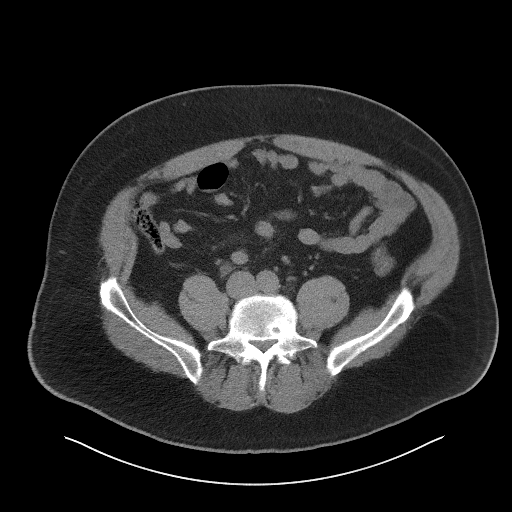
[im 66/121  soft-tissue]
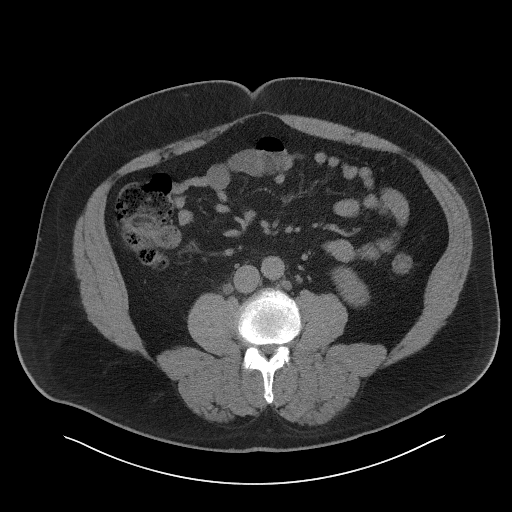
[im 76/121  soft-tissue]
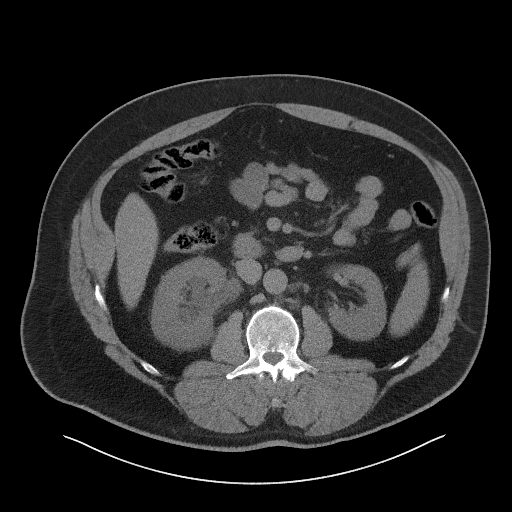
[im 86/121  soft-tissue]
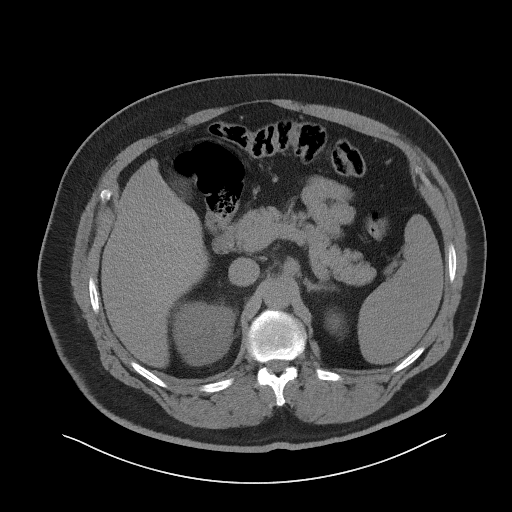
[im 86/121  bone]
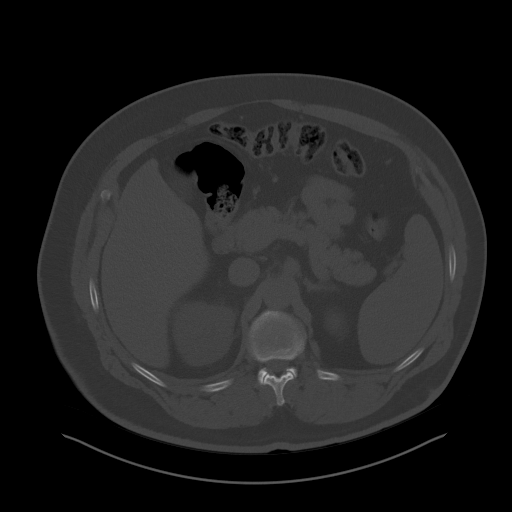
[im 96/121  soft-tissue]
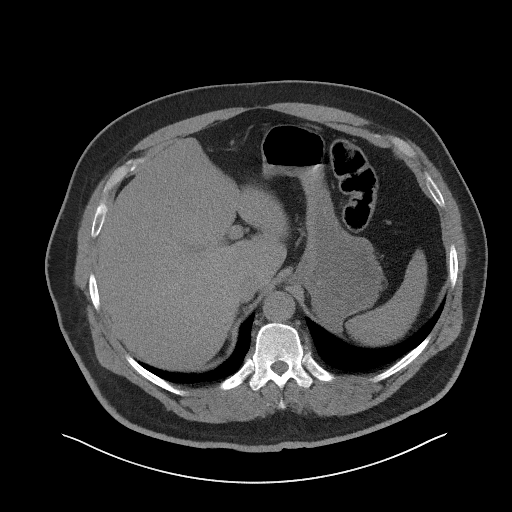
[im 106/121  soft-tissue]
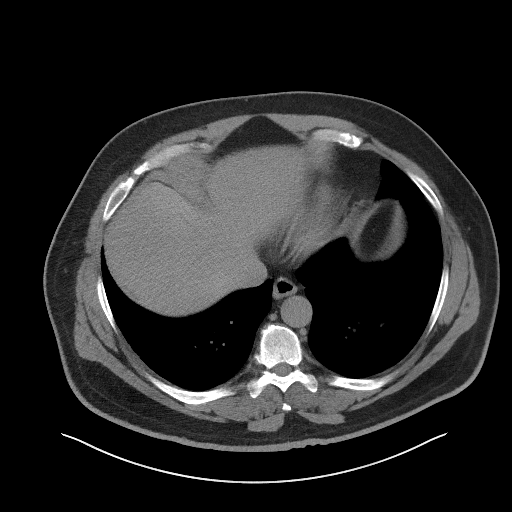
[im 116/121  soft-tissue]
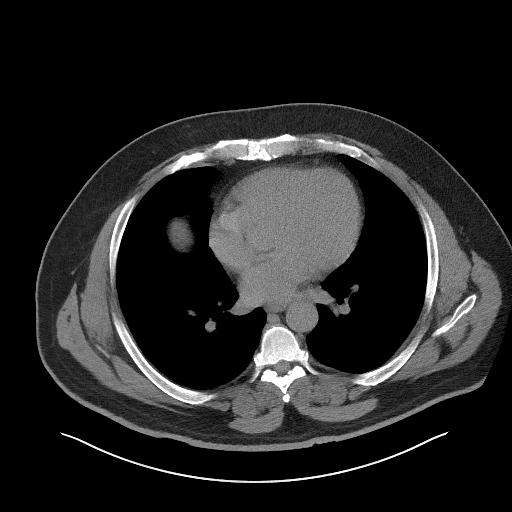

[Series 5: coronal · coronal · 0.94mm/px · 3 of 183 slices shown]
[im 61/183  soft-tissue]
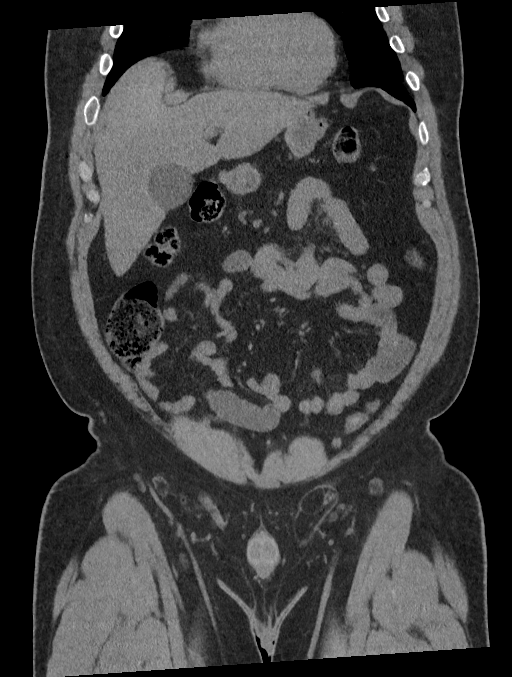
[im 81/183  soft-tissue]
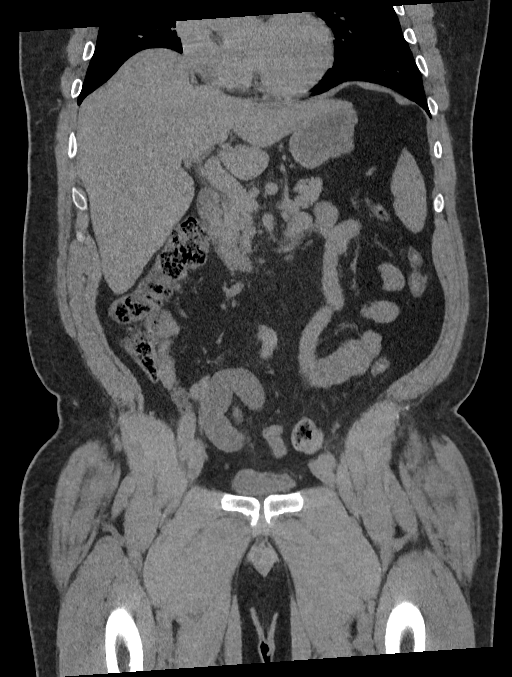
[im 102/183  soft-tissue]
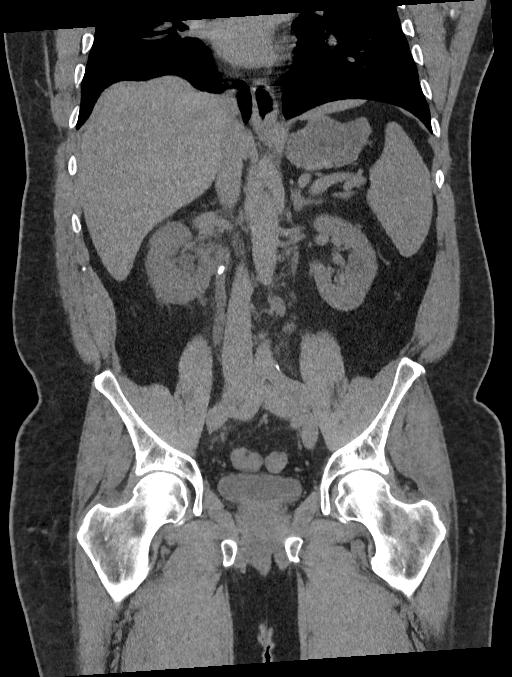

[15 of 46 positions shown; findings below may reference images not displayed]

FINDINGS: Lower chest: No acute pleural or parenchymal lung disease.

Hepatobiliary: No focal liver abnormality is seen. No gallstones,
gallbladder wall thickening, or biliary dilatation.

Pancreas: Unremarkable. No pancreatic ductal dilatation or
surrounding inflammatory changes.

Spleen: Normal in size without focal abnormality.

Adrenals/Urinary Tract: There is high-grade right-sided obstructive
uropathy, with right renal edema and mild perinephric fat stranding.
There are 2 obstructing right ureteral calculi, measuring 9 mm at
the right UPJ image 48/2 and 7 mm in the mid right ureter image
78/2. A nonobstructing 4 mm distal right ureteral calculus is
identified reference image 85/2. There is a nonobstructing 8 mm
calculus in a lower pole calyx of the right kidney.

Multiple punctate nonobstructing left renal calculi are seen largest
measuring 4 mm in the lower pole. No left-sided obstruction.

The bladder is decompressed, limiting evaluation. The adrenals are
unremarkable.

Stomach/Bowel: No bowel obstruction or ileus. Normal appendix right
lower quadrant. No bowel wall thickening or inflammatory change.

Vascular/Lymphatic: No significant vascular findings are present. No
enlarged abdominal or pelvic lymph nodes.

Reproductive: Prostate is unremarkable.

Other: No free fluid or free gas. Fat containing left inguinal
hernia again noted. No bowel herniation.

Musculoskeletal: No acute or destructive bony lesions. Reconstructed
images demonstrate no additional findings.
IMPRESSION: 1. High-grade right-sided obstructive uropathy, with obstructing
right calculi at the UPJ and mid right ureter as described above.
2. Bilateral nonobstructing renal calculi.
3. Fat containing left inguinal hernia.

## 2022-10-06 ENCOUNTER — Other Ambulatory Visit: Payer: Self-pay

## 2022-10-06 ENCOUNTER — Telehealth: Payer: Self-pay | Admitting: Family Medicine

## 2022-10-06 DIAGNOSIS — I10 Essential (primary) hypertension: Secondary | ICD-10-CM

## 2022-10-06 MED ORDER — LOSARTAN POTASSIUM 50 MG PO TABS: ORAL_TABLET | ORAL | 0 refills | Status: AC

## 2022-10-06 NOTE — Telephone Encounter (Signed)
Copied from [place] [id number]. Topic: General - Other >> Oct 06, 2022  1:25 PM [place] B wrote: Reason for CRM: patient called in was denied his refill and says he is already in the midst of switching to [facility] ractice and his [employer] is not covered under Dr Ronnald Ramp, so he isnt sure how he's gonna get and pay for his meds now. Please call back to discuss

## 2022-10-08 ENCOUNTER — Ambulatory Visit: Payer: 59 | Admitting: Family Medicine
